# Patient Record
Sex: Female | Born: 1995 | Race: White | Hispanic: No | Marital: Single | Smoking: Never smoker
Health system: Southern US, Community
[De-identification: ages and names within clinical notes are randomized; demographics above are authoritative.]

## PROBLEM LIST (undated history)

## (undated) ENCOUNTER — Inpatient Hospital Stay: Payer: Self-pay

## (undated) DIAGNOSIS — F419 Anxiety disorder, unspecified: Secondary | ICD-10-CM

## (undated) DIAGNOSIS — F329 Major depressive disorder, single episode, unspecified: Secondary | ICD-10-CM

## (undated) DIAGNOSIS — S0300XA Dislocation of jaw, unspecified side, initial encounter: Secondary | ICD-10-CM

## (undated) DIAGNOSIS — F32A Depression, unspecified: Secondary | ICD-10-CM

## (undated) DIAGNOSIS — G44229 Chronic tension-type headache, not intractable: Secondary | ICD-10-CM

## (undated) HISTORY — PX: INNER EAR SURGERY: SHX679

---
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
## 2014-02-12 ENCOUNTER — Emergency Department: Payer: Self-pay | Admitting: Emergency Medicine

## 2014-03-04 IMAGING — US ABDOMEN ULTRASOUND
1 series · 17 of 25 positions shown · non-contrast
Comparison: none

REASON FOR EXAM: ruq pain
COMMENTS:   May transport without cardiac monitor

[Series 1: abdomen ultrasound · 17 of 54 slices shown]
[im 1/54]
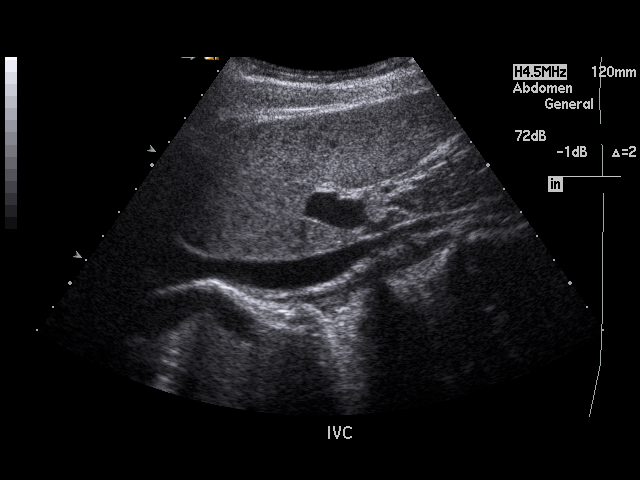
[im 5/54]
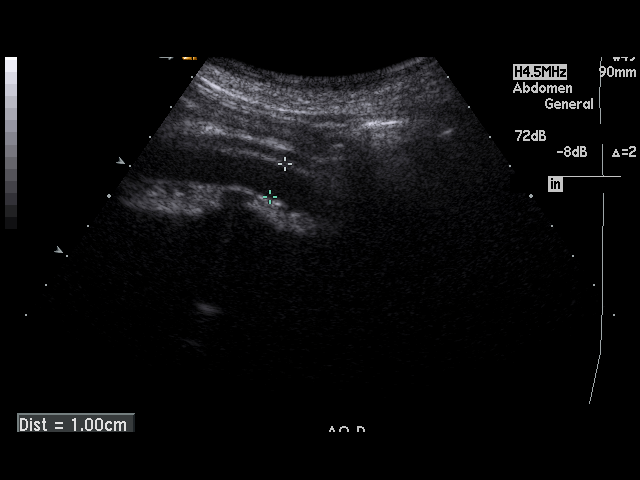
[im 7/54]
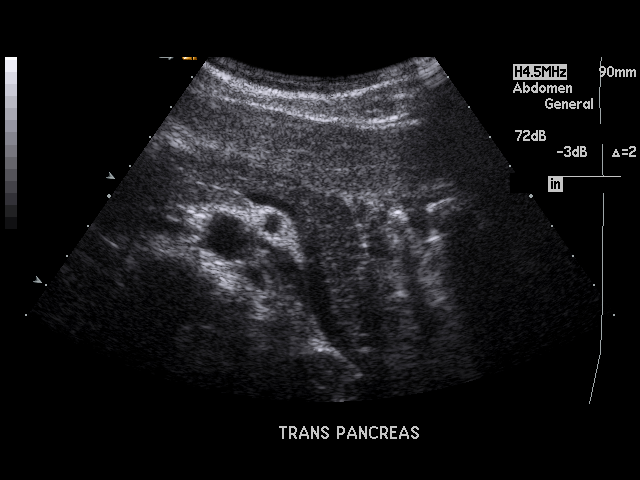
[im 12/54]
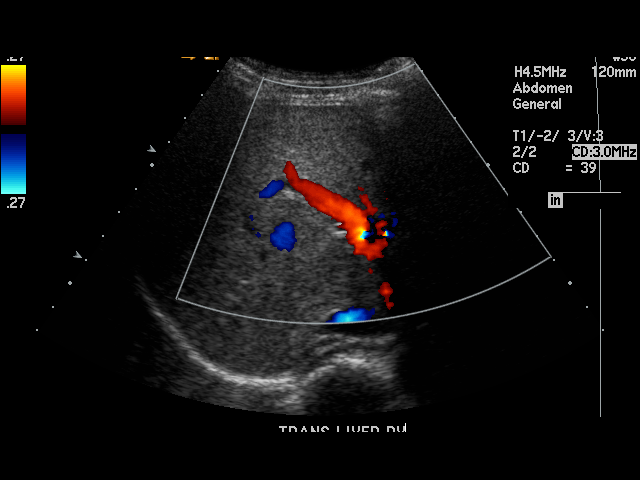
[im 14/54]
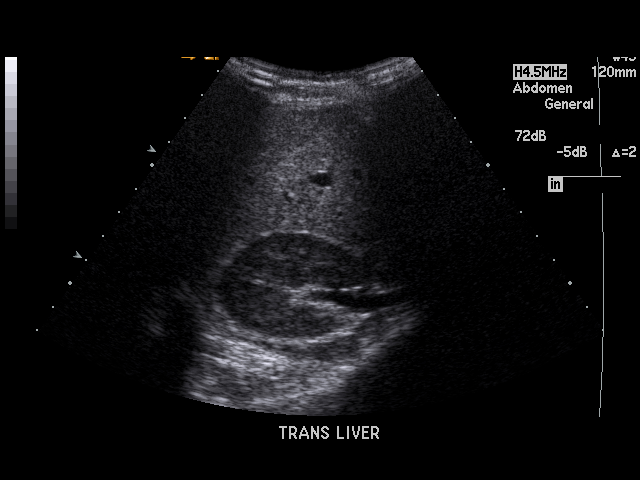
[im 18/54]
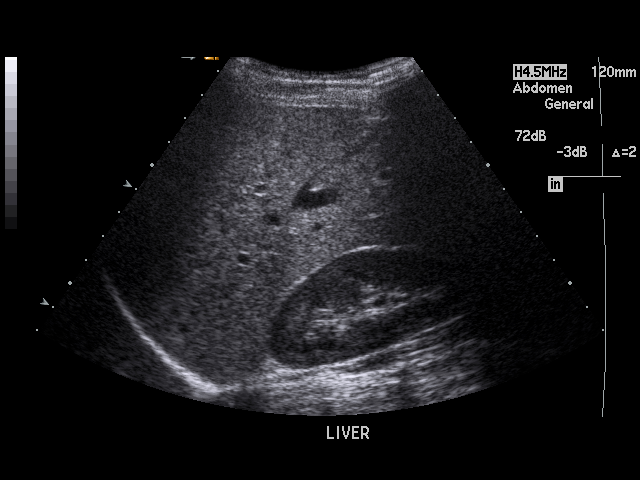
[im 20/54]
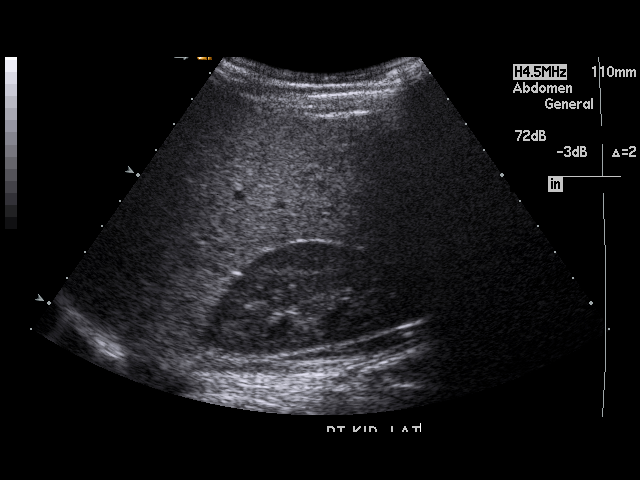
[im 25/54]
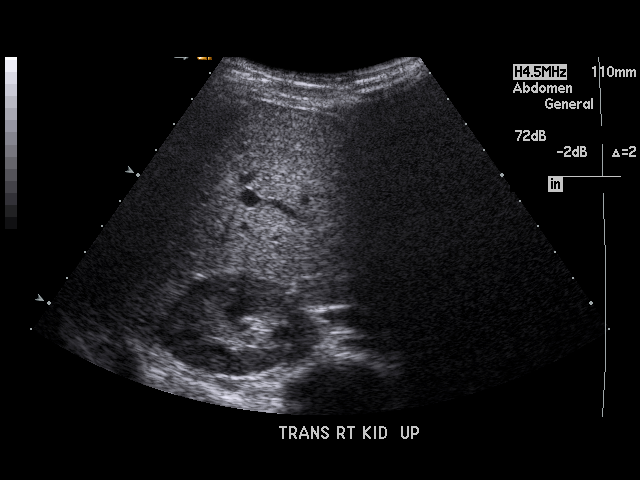
[im 27/54]
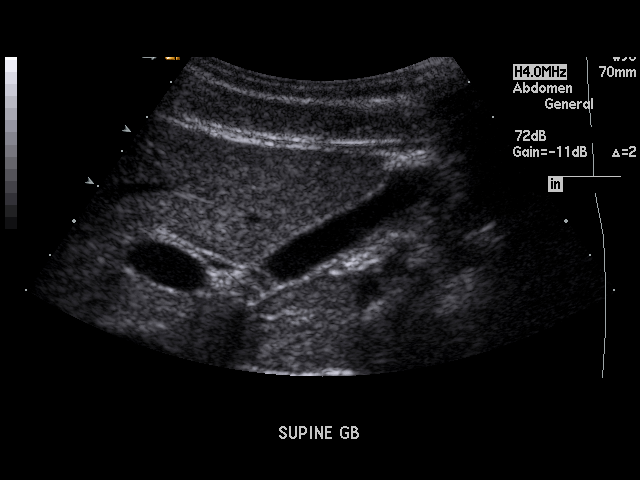
[im 29/54]
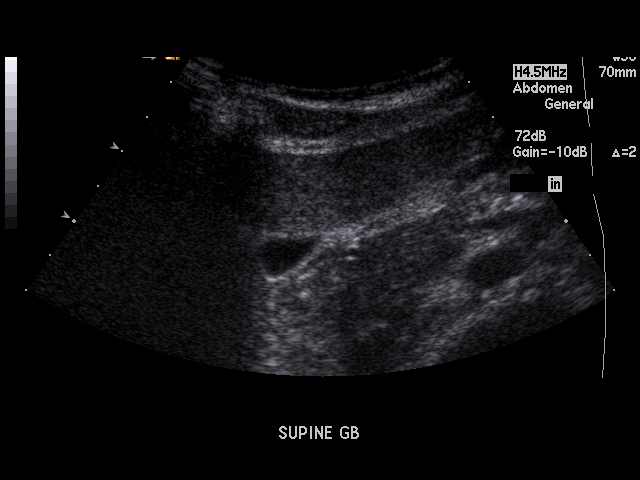
[im 34/54]
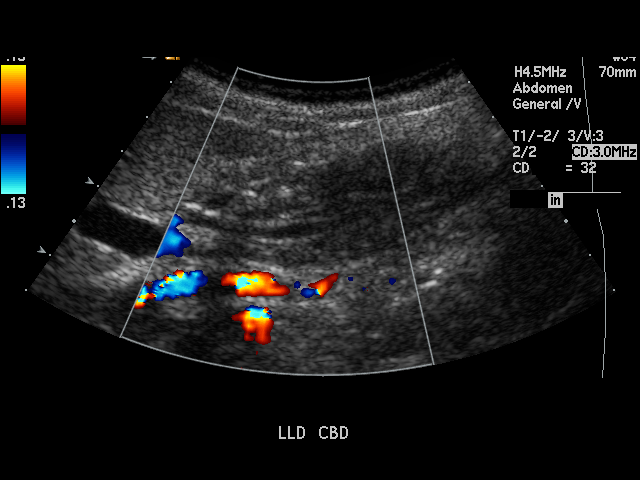
[im 36/54]
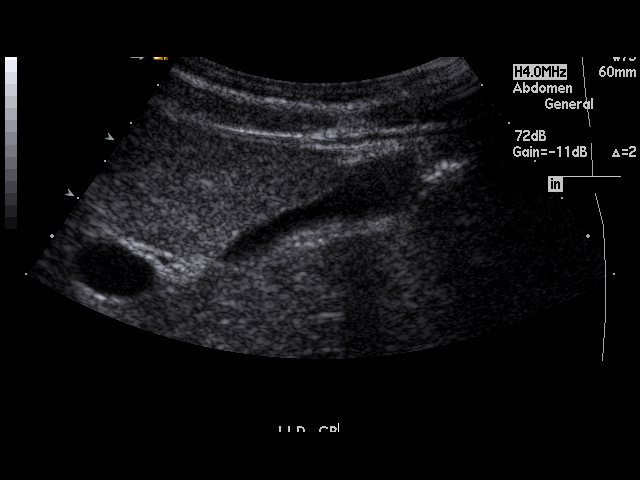
[im 40/54]
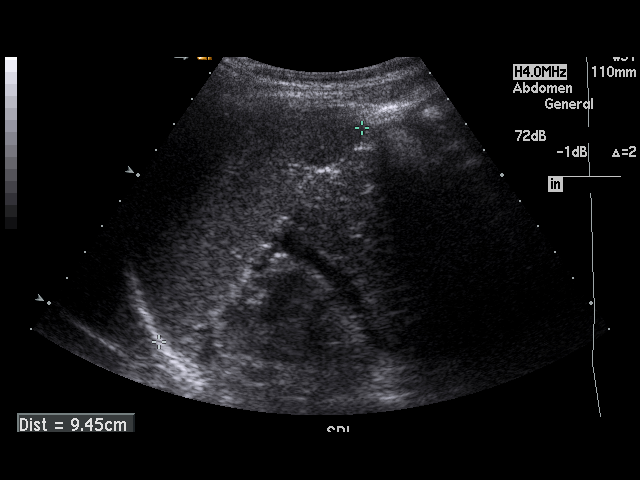
[im 42/54]
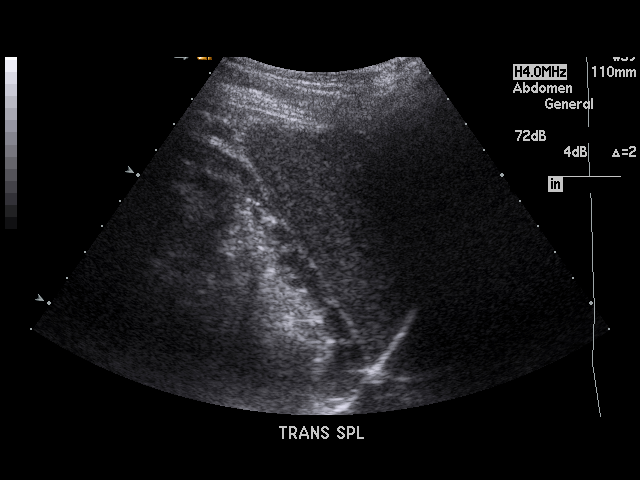
[im 47/54]
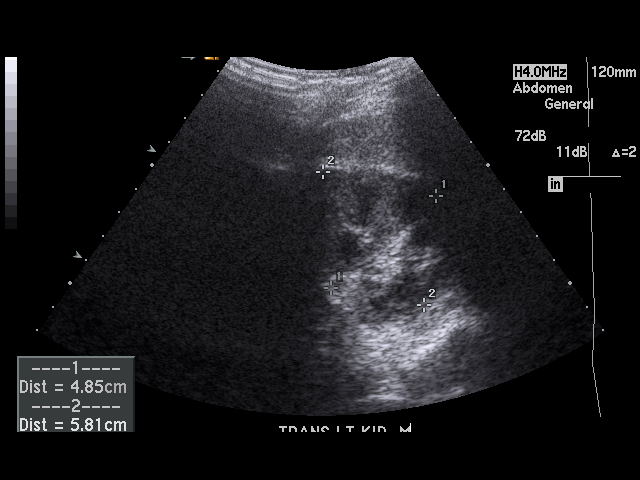
[im 49/54]
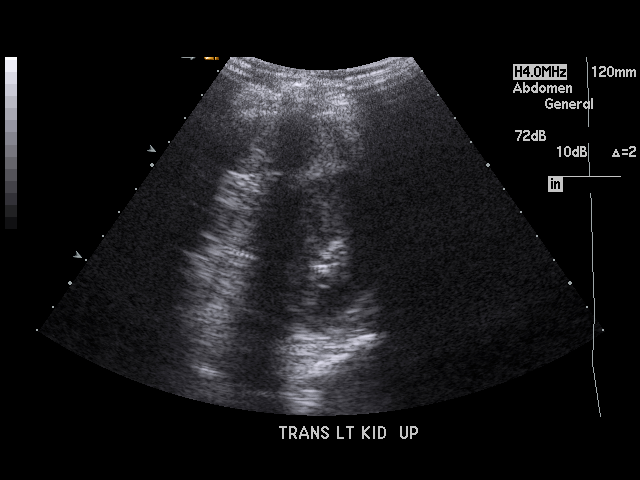
[im 54/54]
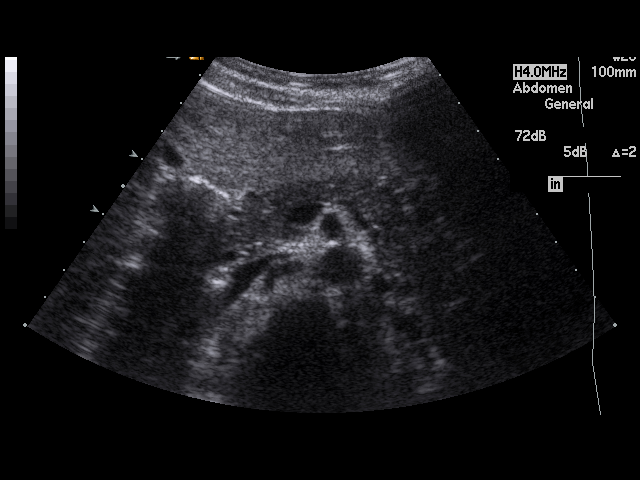

[17 of 25 positions shown; findings below may reference images not displayed]

PROCEDURE:     US  - US ABDOMEN GENERAL SURVEY  - September 06, 2011  [DATE]

RESULT:     The liver exhibits normal echotexture with no focal mass nor
ductal dilation. Portal venous flow is normal in direction toward the liver.
The gallbladder is adequately distended with no evidence of stones, wall
thickening, or pericholecystic fluid. The common bile duct is normal at
mm in diameter.

The pancreas, spleen, and kidneys are normal in appearance. The abdominal
aorta and inferior vena cava are normal in appearance. There is no evidence
of ascites.
IMPRESSION: Normal abdominal ultrasound examination.

A preliminary report was sent to the [HOSPITAL] the lesion of
the study.

## 2014-04-20 ENCOUNTER — Emergency Department: Payer: Self-pay | Admitting: Emergency Medicine

## 2014-05-30 ENCOUNTER — Emergency Department: Payer: Self-pay | Admitting: Emergency Medicine

## 2014-10-09 ENCOUNTER — Emergency Department
Admit: 2014-10-09 | Disposition: A | Discharge status: Home / Self Care | Payer: Self-pay | Admitting: Emergency Medicine

## 2015-03-04 ENCOUNTER — Emergency Department
Admission: EM | Admit: 2015-03-04 | Discharge: 2015-03-04 | Disposition: A | Discharge status: Home / Self Care | Payer: Medicaid Other | Attending: Emergency Medicine | Admitting: Emergency Medicine

## 2015-03-04 ENCOUNTER — Encounter: Payer: Self-pay | Admitting: Emergency Medicine

## 2015-03-04 DIAGNOSIS — R1031 Right lower quadrant pain: Secondary | ICD-10-CM | POA: Insufficient documentation

## 2015-03-04 DIAGNOSIS — Z3202 Encounter for pregnancy test, result negative: Secondary | ICD-10-CM | POA: Diagnosis not present

## 2015-03-04 DIAGNOSIS — R109 Unspecified abdominal pain: Secondary | ICD-10-CM

## 2015-03-04 HISTORY — DX: Dislocation of jaw, unspecified side, initial encounter: S03.00XA

## 2015-03-04 HISTORY — DX: Chronic tension-type headache, not intractable: G44.229

## 2015-03-04 LAB — URINALYSIS COMPLETE WITH MICROSCOPIC (ARMC ONLY)
Bilirubin Urine: NEGATIVE
Glucose, UA: NEGATIVE mg/dL
Hgb urine dipstick: NEGATIVE
Ketones, ur: NEGATIVE mg/dL
Leukocytes, UA: NEGATIVE
Nitrite: NEGATIVE
PH: 6 (ref 5.0–8.0)
Protein, ur: NEGATIVE mg/dL
Specific Gravity, Urine: 1.016 (ref 1.005–1.030)

## 2015-03-04 LAB — CBC WITH DIFFERENTIAL/PLATELET
BASOS ABS: 0.1 10*3/uL (ref 0–0.1)
Basophils Relative: 1 %
EOS PCT: 1 %
Eosinophils Absolute: 0.1 10*3/uL (ref 0–0.7)
HCT: 36.5 % (ref 35.0–47.0)
Hemoglobin: 11.8 g/dL — ABNORMAL LOW (ref 12.0–16.0)
LYMPHS PCT: 26 %
Lymphs Abs: 1.9 10*3/uL (ref 1.0–3.6)
MCH: 27.1 pg (ref 26.0–34.0)
MCHC: 32.5 g/dL (ref 32.0–36.0)
MCV: 83.6 fL (ref 80.0–100.0)
MONO ABS: 0.5 10*3/uL (ref 0.2–0.9)
MONOS PCT: 7 %
Neutro Abs: 4.7 10*3/uL (ref 1.4–6.5)
Neutrophils Relative %: 65 %
PLATELETS: 185 10*3/uL (ref 150–440)
RBC: 4.36 MIL/uL (ref 3.80–5.20)
RDW: 14.4 % (ref 11.5–14.5)
WBC: 7.3 10*3/uL (ref 3.6–11.0)

## 2015-03-04 LAB — POCT PREGNANCY, URINE: Preg Test, Ur: NEGATIVE

## 2015-03-04 MED ORDER — KETOROLAC TROMETHAMINE 30 MG/ML IJ SOLN
30.0000 mg | Freq: Once | INTRAMUSCULAR | Status: AC
  Administered 2015-03-04: 30 mg via INTRAVENOUS
  Filled 2015-03-04: qty 1

## 2015-03-04 MED ORDER — ONDANSETRON HCL 4 MG PO TABS: 4.0000 mg | ORAL_TABLET | Freq: Three times a day (TID) | ORAL | Status: DC | PRN

## 2015-03-04 NOTE — ED Provider Notes (Signed)
Time Seen: Approximately ----------------------------------------- 2:35 PM on 03/04/2015 -----------------------------------------    I have reviewed the triage notes  Chief Complaint: Abdominal Pain   History of Present Illness: Kaitlin Barton is a 19 y.o. female who presents with a one-week history of right lower quadrant abdominal pain. She states it seemed to be worse over the last 24 hours. She states she was concerned she might be pregnant since her last menstrual periods seem to be shortened and was only 3 days. Patient had 2 negative home pregnancy test. She denies any fever at home. She describes some nausea decreased appetite with no vomiting. She denies any dysuria, hematuria, urinary frequency. She denies any vaginal discharge or bleeding. She states she also feels short of breath but it may be secondary to the discomfort. She did not take any pain medication at home. Pain seems to be relatively constant and sometimes worse with movement.   Past Medical History  Diagnosis Date  . Chronic tension headaches   . TMJ (dislocation of temporomandibular joint)     There are no active problems to display for this patient.   History reviewed. No pertinent past surgical history.  History reviewed. No pertinent past surgical history.  No current outpatient prescriptions on file.  Allergies:  Sulfa antibiotics  Family History: No family history on file.  Social History: Social History  Substance Use Topics  . Smoking status: Never Smoker   . Smokeless tobacco: None  . Alcohol Use: No     Review of Systems:   10 point review of systems was performed and was otherwise negative:  Constitutional: No fever Eyes: No visual disturbances ENT: No sore throat, ear pain Cardiac: No chest pain Respiratory: No shortness of breath, wheezing, or stridor Abdomen: Right lower quadrant abdominal pain without radiation to the left lower quadrant she does describe some mild right  flank pain, no vomiting, No diarrhea Endocrine: No weight loss, No night sweats Extremities: No peripheral edema, cyanosis Skin: No rashes, easy bruising Neurologic: No focal weakness, trouble with speech or swollowing Urologic: No dysuria, Hematuria, or urinary frequency   Physical Exam:  ED Triage Vitals  Enc Vitals Group     BP 03/04/15 1348 108/73 mmHg     Pulse Rate 03/04/15 1348 113     Resp 03/04/15 1348 16     Temp 03/04/15 1348 98 F (36.7 C)     Temp Source 03/04/15 1348 Oral     SpO2 03/04/15 1348 100 %     Weight 03/04/15 1348 90 lb (40.824 kg)     Height 03/04/15 1348  (1.6 m)     Head Cir --      Peak Flow --      Pain Score 03/04/15 1352 8     Pain Loc --      Pain Edu? --      Excl. in GC? --     General: Awake , Alert , and Oriented times 3; GCS 15 Head: Normal cephalic , atraumatic Eyes: Pupils equal , round, reactive to light Nose/Throat: No nasal drainage, patent upper airway without erythema or exudate.  Neck: Supple, Full range of motion, No anterior adenopathy or palpable thyroid masses Lungs: Clear to ascultation without wheezes , rhonchi, or rales Heart: Regular rate, regular rhythm without murmurs , gallops , or rubs Abdomen: Soft, non tender without rebound, guarding , or rigidity; bowel sounds positive and symmetric in all 4 quadrants. No organomegaly .  No focal tenderness over  McBurney's point, negative Murphy's sign.      Extremities: 2 plus symmetric pulses. No edema, clubbing or cyanosis Neurologic: normal ambulation, Motor symmetric without deficits, sensory intact Skin: warm, dry, no rashes   Labs:   All laboratory work was reviewed including any pertinent negatives or positives listed below:  Labs Reviewed  URINALYSIS COMPLETEWITH MICROSCOPIC ([facility] ONLY) - Abnormal; Notable for the following:    Color, Urine YELLOW (*)    APPearance CLEAR (*)    Bacteria, UA RARE (*)    Squamous Epithelial / LPF 6-30 (*)    All other  components within normal limits  CBC WITH DIFFERENTIAL/PLATELET  POC URINE PREG, ED   Laboratory work was reviewed with no significant abnormalities  Procedures:  Patient had a repeat exam of her abdomen which does not show any peritoneal signs.    ED Course: Differential diagnosis includes but is not exclusive to ovarian cyst, ovarian torsion, acute appendicitis, urinary tract infection, endometriosis, bowel obstruction, colitis, renal colic, gastroenteritis, etc.  Patient does not appear to have a surgical abdomen such as acute appendicitis. She was advised to return here if she has increasing pain, uncontrolled vomiting at home or any other new concerns such as fever or vaginal discharge or bleeding, etc. Her pain is most likely from a ovarian cyst is based on her history and physical exam. She's been advised take over-the-counter ibuprofen for pain and was prescribed Zofran for nausea   Assessment: Acute unspecified right lower quadrant abdominal pain in a female      Plan:  Patient was advised to return immediately if condition worsens. Patient was advised to follow up with her primary care physician or other specialized physicians involved and in their current assessment.             Jennye Moccasin, MD 03/04/15 [phone]

## 2015-03-04 NOTE — Discharge Instructions (Signed)
Abdominal Pain, Women °Abdominal (stomach, pelvic, or belly) pain can be caused by many things. It is important to tell your doctor: °· The location of the pain. °· Does it come and go or is it present all the time? °· Are there things that start the pain (eating certain foods, exercise)? °· Are there other symptoms associated with the pain (fever, nausea, vomiting, diarrhea)? °All of this is helpful to know when trying to find the cause of the pain. °CAUSES  °· Stomach: virus or bacteria infection, or ulcer. °· Intestine: appendicitis (inflamed appendix), regional ileitis (Crohn's disease), ulcerative colitis (inflamed colon), irritable bowel syndrome, diverticulitis (inflamed diverticulum of the colon), or cancer of the stomach or intestine. °· Gallbladder disease or stones in the gallbladder. °· Kidney disease, kidney stones, or infection. °· Pancreas infection or cancer. °· Fibromyalgia (pain disorder). °· Diseases of the female organs: °¨ Uterus: fibroid (non-cancerous) tumors or infection. °¨ Fallopian tubes: infection or tubal pregnancy. °¨ Ovary: cysts or tumors. °¨ Pelvic adhesions (scar tissue). °¨ Endometriosis (uterus lining tissue growing in the pelvis and on the pelvic organs). °¨ Pelvic congestion syndrome (female organs filling up with blood just before the menstrual period). °¨ Pain with the menstrual period. °¨ Pain with ovulation (producing an egg). °¨ Pain with an IUD (intrauterine device, birth control) in the uterus. °¨ Cancer of the female organs. °· Functional pain (pain not caused by a disease, may improve without treatment). °· Psychological pain. °· Depression. °DIAGNOSIS  °Your doctor will decide the seriousness of your pain by doing an examination. °· Blood tests. °· X-rays. °· Ultrasound. °· CT scan (computed tomography, special type of X-ray). °· MRI (magnetic resonance imaging). °· Cultures, for infection. °· Barium enema (dye inserted in the large intestine, to better view it with  X-rays). °· Colonoscopy (looking in intestine with a lighted tube). °· Laparoscopy (minor surgery, looking in abdomen with a lighted tube). °· Major abdominal exploratory surgery (looking in abdomen with a large incision). °TREATMENT  °The treatment will depend on the cause of the pain.  °· Many cases can be observed and treated at home. °· Over-the-counter medicines recommended by your caregiver. °· Prescription medicine. °· Antibiotics, for infection. °· Birth control pills, for painful periods or for ovulation pain. °· Hormone treatment, for endometriosis. °· Nerve blocking injections. °· Physical therapy. °· Antidepressants. °· Counseling with a psychologist or psychiatrist. °· Minor or major surgery. °HOME CARE INSTRUCTIONS  °· Do not take laxatives, unless directed by your caregiver. °· Take over-the-counter pain medicine only if ordered by your caregiver. Do not take aspirin because it can cause an upset stomach or bleeding. °· Try a clear liquid diet (broth or water) as ordered by your caregiver. Slowly move to a bland diet, as tolerated, if the pain is related to the stomach or intestine. °· Have a thermometer and take your temperature several times a day, and record it. °· Bed rest and sleep, if it helps the pain. °· Avoid sexual intercourse, if it causes pain. °· Avoid stressful situations. °· Keep your follow-up appointments and tests, as your caregiver orders. °· If the pain does not go away with medicine or surgery, you may try: °¨ Acupuncture. °¨ Relaxation exercises (yoga, meditation). °¨ Group therapy. °¨ Counseling. °SEEK MEDICAL CARE IF:  °· You notice certain foods cause stomach pain. °· Your home care treatment is not helping your pain. °· You need stronger pain medicine. °· You want your IUD removed. °· You feel faint or   lightheaded.  You develop nausea and vomiting.  You develop a rash.  You are having side effects or an allergy to your medicine. SEEK IMMEDIATE MEDICAL CARE IF:   Your  pain does not go away or gets worse.  You have a fever.  Your pain is felt only in portions of the abdomen. The right side could possibly be appendicitis. The left lower portion of the abdomen could be colitis or diverticulitis.  You are passing blood in your stools (bright red or black tarry stools, with or without vomiting).  You have blood in your urine.  You develop chills, with or without a fever.  You pass out. MAKE SURE YOU:   Understand these instructions.  Will watch your condition.  Will get help right away if you are not doing well or get worse. Document Released: 03/30/2007 Document Revised: 10/17/2013 Document Reviewed: 04/19/2009 [facility] Patient Information 2015 [place], [place]. This information is not intended to replace advice given to you by your health care provider. Make sure you discuss any questions you have with your health care provider.   Please return immediately if condition worsens. Please contact her primary physician or the physician you were given for referral. If you have any specialist physicians involved in her treatment and plan please also contact them. Thank you for using [facility]. Return especially for increasing pain in the right lower quadrant, fever, persistent vomiting, or any other new concerns

## 2015-03-04 NOTE — ED Notes (Addendum)
Right RLQ abd pain that started a few days ago and has gotten worse. Pt also reports SOB decreased appetite. Denies fever. Denies NVD. Concerns for pregnancy

## 2015-04-27 LAB — OB RESULTS CONSOLE HIV ANTIBODY (ROUTINE TESTING): HIV: NONREACTIVE

## 2015-04-27 LAB — OB RESULTS CONSOLE RUBELLA ANTIBODY, IGM: RUBELLA: NON-IMMUNE/NOT IMMUNE

## 2015-04-27 LAB — OB RESULTS CONSOLE VARICELLA ZOSTER ANTIBODY, IGG: Varicella: IMMUNE

## 2015-04-27 LAB — OB RESULTS CONSOLE RPR: RPR: NONREACTIVE

## 2015-04-27 LAB — OB RESULTS CONSOLE HEPATITIS B SURFACE ANTIGEN: Hepatitis B Surface Ag: NEGATIVE

## 2015-05-20 ENCOUNTER — Emergency Department
Admission: EM | Admit: 2015-05-20 | Discharge: 2015-05-20 | Disposition: A | Discharge status: Home / Self Care | Payer: Medicaid Other | Attending: Emergency Medicine | Admitting: Emergency Medicine

## 2015-05-20 ENCOUNTER — Encounter: Payer: Self-pay | Admitting: Emergency Medicine

## 2015-05-20 DIAGNOSIS — J029 Acute pharyngitis, unspecified: Secondary | ICD-10-CM | POA: Diagnosis not present

## 2015-05-20 DIAGNOSIS — O99511 Diseases of the respiratory system complicating pregnancy, first trimester: Secondary | ICD-10-CM | POA: Diagnosis present

## 2015-05-20 DIAGNOSIS — Z3A13 13 weeks gestation of pregnancy: Secondary | ICD-10-CM | POA: Diagnosis not present

## 2015-05-20 LAB — POCT RAPID STREP A: Streptococcus, Group A Screen (Direct): NEGATIVE

## 2015-05-20 NOTE — ED Provider Notes (Signed)
[facility] Emergency Department Provider Note  ____________________________________________  Time seen: Approximately 4:43 PM  I have reviewed the triage vital signs and the nursing notes.   HISTORY  Chief Complaint Sore Throat    HPI Kaitlin Barton is a 19 y.o. female patient complaining of sore throat and body aches for 3 days. Patient take over-the-counter Delsym for cough. Patient states he weeks' gestation. Patient states she has nausea which is controlled with Zofran. Patient stated this intermittent nonproductive cough. Patient states fever and chills.No other palliative measures taken except for the over-the-counter cough medication. Patient is rating her pain as a 10 over 10.   Past Medical History  Diagnosis Date  . Chronic tension headaches   . TMJ (dislocation of temporomandibular joint)     There are no active problems to display for this patient.   No past surgical history on file.  Current Outpatient Rx  Name  Route  Sig  Dispense  Refill  . ondansetron (ZOFRAN) 4 MG tablet   Oral   Take 1 tablet (4 mg total) by mouth every 8 (eight) hours as needed for nausea or vomiting.   21 tablet   0     Allergies Sulfa antibiotics  No family history on file.  Social History Social History  Substance Use Topics  . Smoking status: Never Smoker   . Smokeless tobacco: Not on file  . Alcohol Use: No    Review of Systems Constitutional: Fever Eyes: No visual changes. ENT: Sore throat Cardiovascular: Denies chest pain. Respiratory: Denies shortness of breath. Gastrointestinal: No abdominal pain.  No nausea, no vomiting.  No diarrhea.  No constipation. Nonproductive cough Genitourinary: Negative for dysuria. Musculoskeletal: Negative for back pain. Skin: Negative for rash. Neurological: Negative for headaches, focal weakness or numbness. Hematological/Lymphatic: Allergic/Immunilogical: Sulfa antibiotics  10-point ROS otherwise  negative.  ____________________________________________   PHYSICAL EXAM:  VITAL SIGNS: ED Triage Vitals  Enc Vitals Group     BP 05/20/15 1543 104/69 mmHg     Pulse Rate 05/20/15 1543 100     Resp 05/20/15 1543 18     Temp 05/20/15 1543 99.1 F (37.3 C)     Temp Source 05/20/15 1543 Oral     SpO2 05/20/15 1543 100 %     Weight 05/20/15 1543 89 lb (40.37 kg)     Height 05/20/15 1543 5\' 3"  (1.6 m)     Head Cir --      Peak Flow --      Pain Score 05/20/15 1548 10     Pain Loc --      Pain Edu? --      Excl. in GC? --     Constitutional: Alert and oriented. Well appearing and in no acute distress. Eyes: Conjunctivae are normal. PERRL. EOMI. Head: Atraumatic. Nose: No congestion/rhinnorhea. Mouth/Throat: Mucous membranes are moist.  Oropharynx erythematous. Neck: No stridor.  No cervical spine tenderness to palpation. Hematological/Lymphatic/Immunilogical: No cervical lymphadenopathy. Cardiovascular: Normal rate, regular rhythm. Grossly normal heart sounds.  Good peripheral circulation. Respiratory: Normal respiratory effort.  No retractions. Lungs CTAB. Gastrointestinal: Soft and nontender. No distention. No abdominal bruits. No CVA tenderness. Musculoskeletal: No lower extremity tenderness nor edema.  No joint effusions. Neurologic:  Normal speech and language. No gross focal neurologic deficits are appreciated. No gait instability. Skin:  Skin is warm, dry and intact. No rash noted. Psychiatric: Mood and affect are normal. Speech and behavior are normal.  ____________________________________________   LABS (all labs ordered are  listed, but only abnormal results are displayed)  Labs Reviewed  POCT RAPID STREP A   ____________________________________________  EKG   ____________________________________________  RADIOLOGY   ____________________________________________   PROCEDURES  Procedure(s) performed: None  Critical Care performed:  No  ____________________________________________   INITIAL IMPRESSION / ASSESSMENT AND PLAN / ED COURSE  Pertinent labs & imaging results that were available during my care of the patient were reviewed by me and considered in my medical decision making (see chart for details).  Viral pharyngitis. Discussed negative rapid strep test with patient. Advised patient cultures pending. Due to the patient gestation and stayed advised supportive care consisting of throat lozenges, Tylenol or ibuprofen. Advised patient to follow-up with family doctor. ____________________________________________   FINAL CLINICAL IMPRESSION(S) / ED DIAGNOSES  Final diagnoses:  Viral pharyngitis      Joni Reining, PA-C 05/20/15 1658  Jeanmarie Plant, MD 05/20/15 2027

## 2015-05-20 NOTE — Discharge Instructions (Signed)

## 2015-05-20 NOTE — ED Notes (Signed)
Sore throat and body aches - [redacted] weeks pregnant - took delsym 9am

## 2015-08-29 ENCOUNTER — Observation Stay
Admission: EM | Admit: 2015-08-29 | Discharge: 2015-08-30 | Disposition: A | Discharge status: Home Health | Payer: Medicaid Other | Attending: Obstetrics and Gynecology | Admitting: Obstetrics and Gynecology

## 2015-08-29 ENCOUNTER — Encounter: Payer: Self-pay | Admitting: *Deleted

## 2015-08-29 DIAGNOSIS — O9989 Other specified diseases and conditions complicating pregnancy, childbirth and the puerperium: Secondary | ICD-10-CM | POA: Insufficient documentation

## 2015-08-29 DIAGNOSIS — O2613 Low weight gain in pregnancy, third trimester: Principal | ICD-10-CM | POA: Insufficient documentation

## 2015-08-29 DIAGNOSIS — O4700 False labor before 37 completed weeks of gestation, unspecified trimester: Secondary | ICD-10-CM | POA: Diagnosis present

## 2015-08-29 DIAGNOSIS — Z882 Allergy status to sulfonamides status: Secondary | ICD-10-CM | POA: Insufficient documentation

## 2015-08-29 DIAGNOSIS — O479 False labor, unspecified: Secondary | ICD-10-CM | POA: Diagnosis present

## 2015-08-29 DIAGNOSIS — Z681 Body mass index (BMI) 19 or less, adult: Secondary | ICD-10-CM | POA: Insufficient documentation

## 2015-08-29 DIAGNOSIS — Z3A28 28 weeks gestation of pregnancy: Secondary | ICD-10-CM | POA: Diagnosis not present

## 2015-08-29 DIAGNOSIS — N898 Other specified noninflammatory disorders of vagina: Secondary | ICD-10-CM | POA: Diagnosis not present

## 2015-08-29 MED ORDER — SODIUM CHLORIDE FLUSH 0.9 % IV SOLN
INTRAVENOUS | Status: AC
  Filled 2015-08-29: qty 20

## 2015-08-29 MED ORDER — SODIUM CHLORIDE 0.9 % IJ SOLN
INTRAMUSCULAR | Status: AC
  Filled 2015-08-29: qty 50

## 2015-08-29 NOTE — OB Triage Note (Signed)
20 year old G1 c/o back pain and vaginal discharge at 6661w0d. Allso c/o burning,urgency, and frequent urination, No bleeding, no LOF +FM.

## 2015-08-30 ENCOUNTER — Telehealth: Payer: Self-pay | Admitting: Obstetrics and Gynecology

## 2015-08-30 DIAGNOSIS — O2613 Low weight gain in pregnancy, third trimester: Secondary | ICD-10-CM | POA: Diagnosis not present

## 2015-08-30 DIAGNOSIS — O479 False labor, unspecified: Secondary | ICD-10-CM | POA: Diagnosis present

## 2015-08-30 DIAGNOSIS — O4700 False labor before 37 completed weeks of gestation, unspecified trimester: Secondary | ICD-10-CM | POA: Diagnosis present

## 2015-08-30 LAB — URINALYSIS COMPLETE WITH MICROSCOPIC (ARMC ONLY)
Bilirubin Urine: NEGATIVE
GLUCOSE, UA: NEGATIVE mg/dL
HGB URINE DIPSTICK: NEGATIVE
Ketones, ur: NEGATIVE mg/dL
NITRITE: NEGATIVE
PH: 8 (ref 5.0–8.0)
Protein, ur: NEGATIVE mg/dL
SPECIFIC GRAVITY, URINE: 1.004 — AB (ref 1.005–1.030)

## 2015-08-30 LAB — CBC
HCT: 27.9 % — ABNORMAL LOW (ref 35.0–47.0)
Hemoglobin: 9.3 g/dL — ABNORMAL LOW (ref 12.0–16.0)
MCH: 26.5 pg (ref 26.0–34.0)
MCHC: 33.4 g/dL (ref 32.0–36.0)
MCV: 79.2 fL — AB (ref 80.0–100.0)
PLATELETS: 197 10*3/uL (ref 150–440)
RBC: 3.52 MIL/uL — AB (ref 3.80–5.20)
RDW: 15.3 % — ABNORMAL HIGH (ref 11.5–14.5)
WBC: 9.9 10*3/uL (ref 3.6–11.0)

## 2015-08-30 LAB — WET PREP, GENITAL
SPERM: NONE SEEN
Yeast Wet Prep HPF POC: NONE SEEN

## 2015-08-30 LAB — ABO/RH: ABO/RH(D): O POS

## 2015-08-30 LAB — TYPE AND SCREEN
ABO/RH(D): O POS
Antibody Screen: NEGATIVE

## 2015-08-30 LAB — CHLAMYDIA/NGC RT PCR (ARMC ONLY)
CHLAMYDIA TR: NOT DETECTED
N GONORRHOEAE: NOT DETECTED

## 2015-08-30 MED ORDER — ACETAMINOPHEN 325 MG PO TABS
650.0000 mg | ORAL_TABLET | ORAL | Status: DC | PRN
  Administered 2015-08-30: 650 mg via ORAL
  Filled 2015-08-30: qty 2

## 2015-08-30 MED ORDER — LACTATED RINGERS IV SOLN
INTRAVENOUS | Status: DC
  Administered 2015-08-30: 01:00:00 via INTRAVENOUS

## 2015-08-30 MED ORDER — CALCIUM CARBONATE ANTACID 500 MG PO CHEW: 2.0000 | CHEWABLE_TABLET | ORAL | Status: DC | PRN

## 2015-08-30 MED ORDER — FERROUS SULFATE 325 (65 FE) MG PO TABS: 325.0000 mg | ORAL_TABLET | Freq: Two times a day (BID) | ORAL | Status: DC

## 2015-08-30 MED ORDER — DOCUSATE SODIUM 100 MG PO CAPS: 100.0000 mg | ORAL_CAPSULE | Freq: Every day | ORAL | Status: DC

## 2015-08-30 MED ORDER — ZOLPIDEM TARTRATE 5 MG PO TABS: 5.0000 mg | ORAL_TABLET | Freq: Every evening | ORAL | Status: DC | PRN

## 2015-08-30 MED ORDER — METRONIDAZOLE 500 MG PO TABS: 500.0000 mg | ORAL_TABLET | Freq: Two times a day (BID) | ORAL | Status: AC

## 2015-08-30 MED ORDER — METRONIDAZOLE 500 MG PO TABS
2000.0000 mg | ORAL_TABLET | Freq: Once | ORAL | Status: AC
  Administered 2015-08-30: 2000 mg via ORAL
  Filled 2015-08-30: qty 4
  Filled 2015-08-30: qty 1

## 2015-08-30 MED ORDER — LACTATED RINGERS IV BOLUS (SEPSIS)
1000.0000 mL | Freq: Once | INTRAVENOUS | Status: AC
  Administered 2015-08-30: 1000 mL via INTRAVENOUS

## 2015-08-30 MED ORDER — PRENATAL MULTIVITAMIN CH
1.0000 | ORAL_TABLET | Freq: Every day | ORAL | Status: DC
  Filled 2015-08-30: qty 1

## 2015-08-30 MED ORDER — METRONIDAZOLE 500 MG PO TABS
ORAL_TABLET | ORAL | Status: AC
  Administered 2015-08-30: 2000 mg via ORAL
  Filled 2015-08-30: qty 3

## 2015-08-30 NOTE — H&P (Signed)
MD NOTE  LMP: 02/12/15 EDD: 11/21/2015  HPI: Kaitlin Barton G1P0 @ 28wks here with back pain intermittent, dysuria, since early this evening. Came on suddenly. Describes back pain as 7/10. Also with vaginal discharge. Used vaginal gel this evening for discharge. No VB, no lof, +FM. No fevers/chills/n/v/throat pain/cough/runny nose. No other complaints.   APC: [facility] 1. Poor weight gain in pregnancy - visits with [facility] Pre-gravid BMI 15.8. Patient reports that she "has always been small." Denies history of eating disorder. Reports significant N/V in first trimester, but now this is much improved and she is eating well and gaining weight. Seeing Nutrition at local [facility] regularly. Eating high-calorie diet including Boost TID, whole milk.  - Given pre-gravid BMI <18.5 kg/m2 (underweight): recommended weight gain 28 to 40 lbs (12.5 to 18.0 kg) - TWG: 2.404 kg - below target but gained weight from last visit, will continue to monitor closely - Encouraged regular follow up with Nutrition at [facility] (pt declined referral to [facility]) - Fetal biometry is consistently lagging behind gestational dating. Decision made NOT to re-date by MFM. - Last EFW 488g (26%) on 07/24/15.  Next growth [place] scheduled for 09/18/15.   Labs:   GTT 1 HOUR PERFORMABLE - Final result (08/06/2015 12:40 PM)  Component Value Range  Glucose, GTT - 1 Hour 105 Undefined mg/dL   Rubella non-immune  Past Medical History  Diagnosis Date  . Chronic tension headaches   . TMJ (dislocation of temporomandibular joint)     History reviewed. No pertinent past surgical history.  Allergies  Allergen Reactions  . Sulfa Antibiotics Hives    Social History   Social History  . Marital Status: Single    Spouse Name: N/A  . Number of Children: N/A  . Years of Education: N/A   Occupational History  . Not on file.   Social History Main Topics  . Smoking status: Never Smoker   . Smokeless tobacco: Not on file  . Alcohol Use: No  .  Drug Use: No  . Sexual Activity: Not on file   Other Topics Concern  . Not on file   Social History Narrative    No current facility-administered medications on file prior to encounter.   Current Outpatient Prescriptions on File Prior to Encounter  Medication Sig Dispense Refill  . ondansetron (ZOFRAN) 4 MG tablet Take 1 tablet (4 mg total) by mouth every 8 (eight) hours as needed for nausea or vomiting. 21 tablet 0    O: Filed Vitals:   08/29/15 2245 08/29/15 2256  BP: 125/76   Pulse: 103   Temp: 98.4 F (36.9 C) 98.4 F (36.9 C)  Resp: 20     General appearance: alert, cooperative and no distress Head: Normocephalic, without obvious abnormality, atraumatic Eyes: conjunctivae/corneas clear. PERRL, EOM's intact. Fundi benign. Abdomen: soft, non-tender; bowel sounds normal; no masses,  no organomegaly Pelvic: yellow vaginal discharge, no blood Extremities: extremities normal, atraumatic, no cyanosis or edema and very thin Skin: Skin color, texture, turgor normal. No rashes or lesions   SVE: 12:10am closed/thick  EFM: 150 mod var, 10x10 accels, no decels Toco: Q3 min  A/P:  20yo G1P0 @ 28wks with preterm contractions likely due to dehydration. No cervical change on initial exam. Will IVH aggressively and w/u for UTI.   1. Preterm contractions - NPO - IVH 1L bolus in 1 hr, then 125cc/hr - UA/Ucx - wet prep - recheck in 2 hours - if cervical change or continued contractions, will consider BMZ  and nifedipine  Kaitlin DachJohanna K Izzabella Besse, MD

## 2015-08-30 NOTE — Telephone Encounter (Signed)
Repeat SVE still closed after 2 hours  Contractions resolved, pt improved Wet prep with Trich and BV Treated with 2g Flagyl in triage, rx for 6 more days of BID flagyl for BV completion Condoms  Encouraged hydration.  Precautions given.   D/C Home

## 2015-08-30 NOTE — Discharge Instructions (Signed)
Take medications as prescribed 1. Trichomonas - 2g flagyl today 2. BV -  twice daily for 6 days  CONDOMS FOR 2 WEEKS  Metronidazole tablets or capsules What is this medicine? METRONIDAZOLE (me troe NI da zole) is an antiinfective. It is used to treat certain kinds of bacterial and protozoal infections. It will not work for colds, flu, or other viral infections. This medicine may be used for other purposes; ask your health care provider or pharmacist if you have questions. What should I tell my health care provider before I take this medicine? They need to know if you have any of these conditions: -anemia or other blood disorders -disease of the nervous system -fungal or yeast infection -if you drink alcohol containing drinks -liver disease -seizures -an unusual or allergic reaction to metronidazole, or other medicines, foods, dyes, or preservatives -pregnant or trying to get pregnant -breast-feeding How should I use this medicine? Take this medicine by mouth with a full glass of water. Follow the directions on the prescription label. Take your medicine at regular intervals. Do not take your medicine more often than directed. Take all of your medicine as directed even if you think you are better. Do not skip doses or stop your medicine early. Talk to your pediatrician regarding the use of this medicine in children. Special care may be needed. Overdosage: If you think you have taken too much of this medicine contact a poison control center or emergency room at once. NOTE: This medicine is only for you. Do not share this medicine with others. What if I miss a dose? If you miss a dose, take it as soon as you can. If it is almost time for your next dose, take only that dose. Do not take double or extra doses. What may interact with this medicine? Do not take this medicine with any of the following medications: -alcohol or any product that contains alcohol -amprenavir oral  solution -cisapride -disulfiram -dofetilide -dronedarone -paclitaxel injection -pimozide -ritonavir oral solution -sertraline oral solution -sulfamethoxazole-trimethoprim injection -thioridazine -ziprasidone This medicine may also interact with the following medications: -birth control pills -cimetidine -lithium -other medicines that prolong the QT interval (cause an abnormal heart rhythm) -phenobarbital -phenytoin -warfarin This list may not describe all possible interactions. Give your health care provider a list of all the medicines, herbs, non-prescription drugs, or dietary supplements you use. Also tell them if you smoke, drink alcohol, or use illegal drugs. Some items may interact with your medicine. What should I watch for while using this medicine? Tell your doctor or health care professional if your symptoms do not improve or if they get worse. You may get drowsy or dizzy. Do not drive, use machinery, or do anything that needs mental alertness until you know how this medicine affects you. Do not stand or sit up quickly, especially if you are an older patient. This reduces the risk of dizzy or fainting spells. Avoid alcoholic drinks while you are taking this medicine and for three days afterward. Alcohol may make you feel dizzy, sick, or flushed. If you are being treated for a sexually transmitted disease, avoid sexual contact until you have finished your treatment. Your sexual partner may also need treatment. What side effects may I notice from receiving this medicine? Side effects that you should report to your doctor or health care professional as soon as possible: -allergic reactions like skin rash or hives, swelling of the face, lips, or tongue -confusion, clumsiness -difficulty speaking -discolored or sore mouth -dizziness -  fever, infection -numbness, tingling, pain or weakness in the hands or feet -trouble passing urine or change in the amount of urine -redness,  blistering, peeling or loosening of the skin, including inside the mouth -seizures -unusually weak or tired -vaginal irritation, dryness, or discharge Side effects that usually do not require medical attention (report to your doctor or health care professional if they continue or are bothersome): -diarrhea -headache -irritability -metallic taste -nausea -stomach pain or cramps -trouble sleeping This list may not describe all possible side effects. Call your doctor for medical advice about side effects. You may report side effects to FDA at 1-800-FDA-1088. Where should I keep my medicine? Keep out of the reach of children. Store at room temperature below 25 degrees C (77 degrees F). Protect from light. Keep container tightly closed. Throw away any unused medicine after the expiration date. NOTE: This sheet is a summary. It may not cover all possible information. If you have questions about this medicine, talk to your doctor, pharmacist, or health care provider.    2016, Elsevier/Gold Standard. (2013-01-07 14:08:39)  Trichomoniasis Trichomoniasis is an infection caused by an organism called Trichomonas. The infection can affect both women and men. In women, the outer female genitalia and the vagina are affected. In men, the penis is mainly affected, but the prostate and other reproductive organs can also be involved. Trichomoniasis is a sexually transmitted infection (STI) and is most often passed to another person through sexual contact.  RISK FACTORS  Having unprotected sexual intercourse.  Having sexual intercourse with an infected partner. SIGNS AND SYMPTOMS  Symptoms of trichomoniasis in women include:  Abnormal gray-green frothy vaginal discharge.  Itching and irritation of the vagina.  Itching and irritation of the area outside the vagina. Symptoms of trichomoniasis in men include:   Penile discharge with or without pain.  Pain during urination. This results from  inflammation of the urethra. DIAGNOSIS  Trichomoniasis may be found during a Pap test or physical exam. Your health care provider may use one of the following methods to help diagnose this infection:  Testing the pH of the vagina with a test tape.  Using a vaginal swab test that checks for the Trichomonas organism. A test is available that provides results within a few minutes.  Examining a urine sample.  Testing vaginal secretions. Your health care provider may test you for other STIs, including HIV. TREATMENT   You may be given medicine to fight the infection. Women should inform their health care provider if they could be or are pregnant. Some medicines used to treat the infection should not be taken during pregnancy.  Your health care provider may recommend over-the-counter medicines or creams to decrease itching or irritation.  Your sexual partner will need to be treated if infected.  Your health care provider may test you for infection again 3 months after treatment. HOME CARE INSTRUCTIONS   Take medicines only as directed by your health care provider.  Take over-the-counter medicine for itching or irritation as directed by your health care provider.  Do not have sexual intercourse while you have the infection.  Women should not douche or wear tampons while they have the infection.  Discuss your infection with your partner. Your partner may have gotten the infection from you, or you may have gotten it from your partner.  Have your sex partner get examined and treated if necessary.  Practice safe, informed, and protected sex.  See your health care provider for other STI testing. SEEK MEDICAL  CARE IF:   You still have symptoms after you finish your medicine.  You develop abdominal pain.  You have pain when you urinate.  You have bleeding after sexual intercourse.  You develop a rash.  Your medicine makes you sick or makes you throw up (vomit). MAKE SURE  YOU:  Understand these instructions.  Will watch your condition.  Will get help right away if you are not doing well or get worse.   This information is not intended to replace advice given to you by your health care provider. Make sure you discuss any questions you have with your health care provider.   Document Released: 11/26/2000 Document Revised: 06/23/2014 Document Reviewed: 03/14/2013 Elsevier Interactive Patient Education 2016 Elsevier Inc.  Bacterial Vaginosis Bacterial vaginosis is a vaginal infection that occurs when the normal balance of bacteria in the vagina is disrupted. It results from an overgrowth of certain bacteria. This is the most common vaginal infection in women of childbearing age. Treatment is important to prevent complications, especially in pregnant women, as it can cause a premature delivery. CAUSES  Bacterial vaginosis is caused by an increase in harmful bacteria that are normally present in smaller amounts in the vagina. Several different kinds of bacteria can cause bacterial vaginosis. However, the reason that the condition develops is not fully understood. RISK FACTORS Certain activities or behaviors can put you at an increased risk of developing bacterial vaginosis, including:  Having a new sex partner or multiple sex partners.  Douching.  Using an intrauterine device (IUD) for contraception. Women do not get bacterial vaginosis from toilet seats, bedding, swimming pools, or contact with objects around them. SIGNS AND SYMPTOMS  Some women with bacterial vaginosis have no signs or symptoms. Common symptoms include:  Grey vaginal discharge.  A fishlike odor with discharge, especially after sexual intercourse.  Itching or burning of the vagina and vulva.  Burning or pain with urination. DIAGNOSIS  Your health care provider will take a medical history and examine the vagina for signs of bacterial vaginosis. A sample of vaginal fluid may be taken.  Your health care provider will look at this sample under a microscope to check for bacteria and abnormal cells. A vaginal pH test may also be done.  TREATMENT  Bacterial vaginosis may be treated with antibiotic medicines. These may be given in the form of a pill or a vaginal cream. A second round of antibiotics may be prescribed if the condition comes back after treatment. Because bacterial vaginosis increases your risk for sexually transmitted diseases, getting treated can help reduce your risk for chlamydia, gonorrhea, HIV, and herpes. HOME CARE INSTRUCTIONS   Only take over-the-counter or prescription medicines as directed by your health care provider.  If antibiotic medicine was prescribed, take it as directed. Make sure you finish it even if you start to feel better.  Tell all sexual partners that you have a vaginal infection. They should see their health care provider and be treated if they have problems, such as a mild rash or itching.  During treatment, it is important that you follow these instructions:  Avoid sexual activity or use condoms correctly.  Do not douche.  Avoid alcohol as directed by your health care provider.  Avoid breastfeeding as directed by your health care provider. SEEK MEDICAL CARE IF:   Your symptoms are not improving after 3 days of treatment.  You have increased discharge or pain.  You have a fever. MAKE SURE YOU:   Understand these instructions.  Will watch your condition.  Will get help right away if you are not doing well or get worse. FOR MORE INFORMATION  Centers for Disease Control and Prevention, Division of STD Prevention: SolutionApps.co.za American Sexual Health Association (ASHA): www.ashastd.org    This information is not intended to replace advice given to you by your health care provider. Make sure you discuss any questions you have with your health care provider.   Document Released: 06/02/2005 Document Revised: 06/23/2014 Document  Reviewed: 01/12/2013 Elsevier Interactive Patient Education [employer]

## 2015-09-01 LAB — URINE CULTURE

## 2015-10-10 ENCOUNTER — Encounter: Payer: Self-pay | Admitting: *Deleted

## 2015-10-10 ENCOUNTER — Observation Stay
Admission: EM | Admit: 2015-10-10 | Discharge: 2015-10-13 | Disposition: A | Discharge status: Home / Self Care | Payer: Medicaid Other | Attending: Obstetrics and Gynecology | Admitting: Obstetrics and Gynecology

## 2015-10-10 DIAGNOSIS — O365990 Maternal care for other known or suspected poor fetal growth, unspecified trimester, not applicable or unspecified: Secondary | ICD-10-CM | POA: Diagnosis present

## 2015-10-10 DIAGNOSIS — R51 Headache: Secondary | ICD-10-CM | POA: Diagnosis not present

## 2015-10-10 DIAGNOSIS — O365910 Maternal care for other known or suspected poor fetal growth, first trimester, not applicable or unspecified: Secondary | ICD-10-CM | POA: Insufficient documentation

## 2015-10-10 DIAGNOSIS — Z3A11 11 weeks gestation of pregnancy: Secondary | ICD-10-CM | POA: Diagnosis not present

## 2015-10-10 DIAGNOSIS — Z79899 Other long term (current) drug therapy: Secondary | ICD-10-CM | POA: Insufficient documentation

## 2015-10-10 DIAGNOSIS — O131 Gestational [pregnancy-induced] hypertension without significant proteinuria, first trimester: Principal | ICD-10-CM | POA: Insufficient documentation

## 2015-10-10 DIAGNOSIS — O26891 Other specified pregnancy related conditions, first trimester: Secondary | ICD-10-CM | POA: Insufficient documentation

## 2015-10-10 DIAGNOSIS — Z882 Allergy status to sulfonamides status: Secondary | ICD-10-CM | POA: Diagnosis not present

## 2015-10-10 DIAGNOSIS — O163 Unspecified maternal hypertension, third trimester: Secondary | ICD-10-CM

## 2015-10-10 LAB — TYPE AND SCREEN
ABO/RH(D): O POS
Antibody Screen: NEGATIVE

## 2015-10-10 LAB — WET PREP, GENITAL
Clue Cells Wet Prep HPF POC: NONE SEEN
SPERM: NONE SEEN
TRICH WET PREP: NONE SEEN
Yeast Wet Prep HPF POC: NONE SEEN

## 2015-10-10 LAB — CBC
HCT: 34.7 % — ABNORMAL LOW (ref 35.0–47.0)
HEMOGLOBIN: 11.4 g/dL — AB (ref 12.0–16.0)
MCH: 26.7 pg (ref 26.0–34.0)
MCHC: 33 g/dL (ref 32.0–36.0)
MCV: 81 fL (ref 80.0–100.0)
Platelets: 201 10*3/uL (ref 150–440)
RBC: 4.28 MIL/uL (ref 3.80–5.20)
RDW: 18.4 % — ABNORMAL HIGH (ref 11.5–14.5)
WBC: 7.5 10*3/uL (ref 3.6–11.0)

## 2015-10-10 LAB — COMPREHENSIVE METABOLIC PANEL
ALK PHOS: 204 U/L — AB (ref 38–126)
ALT: 11 U/L — ABNORMAL LOW (ref 14–54)
ANION GAP: 10 (ref 5–15)
AST: 23 U/L (ref 15–41)
Albumin: 3.2 g/dL — ABNORMAL LOW (ref 3.5–5.0)
BILIRUBIN TOTAL: 0.4 mg/dL (ref 0.3–1.2)
BUN: 5 mg/dL — ABNORMAL LOW (ref 6–20)
CALCIUM: 9.1 mg/dL (ref 8.9–10.3)
CO2: 22 mmol/L (ref 22–32)
CREATININE: 0.57 mg/dL (ref 0.44–1.00)
Chloride: 106 mmol/L (ref 101–111)
Glucose, Bld: 78 mg/dL (ref 65–99)
Potassium: 4.3 mmol/L (ref 3.5–5.1)
SODIUM: 138 mmol/L (ref 135–145)
TOTAL PROTEIN: 7 g/dL (ref 6.5–8.1)

## 2015-10-10 LAB — URINALYSIS COMPLETE WITH MICROSCOPIC (ARMC ONLY)
BACTERIA UA: NONE SEEN
BILIRUBIN URINE: NEGATIVE
Glucose, UA: NEGATIVE mg/dL
HGB URINE DIPSTICK: NEGATIVE
Ketones, ur: NEGATIVE mg/dL
NITRITE: NEGATIVE
PH: 7 (ref 5.0–8.0)
Protein, ur: NEGATIVE mg/dL
SPECIFIC GRAVITY, URINE: 1.002 — AB (ref 1.005–1.030)

## 2015-10-10 LAB — PROTEIN / CREATININE RATIO, URINE: Creatinine, Urine: 18 mg/dL

## 2015-10-10 MED ORDER — ACETAMINOPHEN 325 MG PO TABS
650.0000 mg | ORAL_TABLET | ORAL | Status: DC | PRN
  Administered 2015-10-11: 650 mg via ORAL
  Filled 2015-10-10: qty 2

## 2015-10-10 MED ORDER — DOCUSATE SODIUM 100 MG PO CAPS
100.0000 mg | ORAL_CAPSULE | Freq: Every day | ORAL | Status: DC
  Filled 2015-10-10 (×2): qty 1

## 2015-10-10 MED ORDER — CALCIUM CARBONATE ANTACID 500 MG PO CHEW: 2.0000 | CHEWABLE_TABLET | ORAL | Status: DC | PRN

## 2015-10-10 MED ORDER — LABETALOL HCL 5 MG/ML IV SOLN
20.0000 mg | INTRAVENOUS | Status: DC | PRN
  Filled 2015-10-10: qty 16

## 2015-10-10 MED ORDER — ZOLPIDEM TARTRATE 5 MG PO TABS: 5.0000 mg | ORAL_TABLET | Freq: Every evening | ORAL | Status: DC | PRN

## 2015-10-10 MED ORDER — LACTATED RINGERS IV SOLN
INTRAVENOUS | Status: DC
  Administered 2015-10-10: 500 mL/h via INTRAVENOUS

## 2015-10-10 MED ORDER — PRENATAL MULTIVITAMIN CH
1.0000 | ORAL_TABLET | Freq: Every day | ORAL | Status: DC
  Administered 2015-10-11 – 2015-10-13 (×3): 1 via ORAL
  Filled 2015-10-10 (×5): qty 1

## 2015-10-10 MED ORDER — HYDRALAZINE HCL 20 MG/ML IJ SOLN
10.0000 mg | Freq: Once | INTRAMUSCULAR | Status: AC | PRN
  Administered 2015-10-10: 10 mg via INTRAVENOUS
  Filled 2015-10-10: qty 1

## 2015-10-10 NOTE — OB Triage Note (Signed)
Pt. Came in from home with lower left back pain and lower right abd. Pain started around 12:30, today. Pain. No vaginal bleeding, per patient.

## 2015-10-10 NOTE — OB Triage Provider Note (Addendum)
Antepartum Admission/Observation   History of Present Illness: Kaitlin Barton is a 20 y.o. G1P0 at 2611w0d presenting with vague left sided back/flank pain that is intermittent and RLQ pain which has resolved. She is a patient of [facility] and [facility] for elevated BP with IUGR; I do not see Doppler results. She is getting weekly NSTs and is in the middle of a 24hr urine collection today. She did have a headache earlier today which has spontaneously resolved. She had blurred vision while looking at her phone which resolved spontaneously.  - Fetal biometry consistently lagging behind gestational dating. - 09/18/15: EFW 1118g (6%), AC < 3%, AFI 12.6, normal Dopplers. Per US report: These fetal findings could be representative of fetal growth restriction or a constitutionally small normal fetus. Rec weekly NSTs, repeat growth 3-4 wks.   She also has a hx of kidney stones in the past, none in this pregnancy. She is concerned for a UTI.  Good fetal movement, normal vaginal discharge, no itching. Some burning/tenderness with urination. No LOF, no contractions, though the monitor is showing occasional contractions with irritability. No recent intercourse.   Patient Active Problem List   Diagnosis Date Noted  . Preterm contractions 08/30/2015    Past Medical History  Diagnosis Date  . Chronic tension headaches   . TMJ (dislocation of temporomandibular joint)     History reviewed. No pertinent past surgical history.  OB History  Gravida Para Term Preterm AB SAB TAB Ectopic Multiple Living  1             # Outcome Date GA Lbr Len/2nd Weight Sex Delivery Anes PTL Lv  1 Current               Social History   Social History  . Marital Status: Single    Spouse Name: N/A  . Number of Children: N/A  . Years of Education: N/A   Social History Main Topics  . Smoking status: Never Smoker   . Smokeless tobacco: None  . Alcohol Use: No  . Drug Use:  No  . Sexual Activity: Yes   Other Topics Concern  . None   Social History Narrative    History reviewed. No pertinent family history.  Allergies  Allergen Reactions  . Sulfa Antibiotics Hives    Prescriptions prior to admission  Medication Sig Dispense Refill Last Dose  . butalbital-acetaminophen-caffeine (FIORICET, ESGIC) 50-325-40 MG tablet Take by mouth 2 (two) times daily as needed for headache.   Past Month at Unknown time  . ferrous sulfate 325 (65 FE) MG tablet Take 1 tablet (325 mg total) by mouth 2 (two) times daily with a meal. 90 tablet 3 10/10/2015 at Unknown time  . Prenatal Vit-Fe Fumarate-FA (PRENATAL MULTIVITAMIN) TABS tablet Take 1 tablet by mouth daily at 12 noon.   Past Week at Unknown time  . ondansetron (ZOFRAN) 4 MG tablet Take 1 tablet (4 mg total) by mouth every 8 (eight) hours as needed for nausea or vomiting. (Patient not taking: Reported on 10/10/2015) 21 tablet 0 Not Taking at Unknown time    Review of Systems - Negative except as noted in HPI  Vitals:  BP 141/101 mmHg  Pulse 91  Temp(Src) 98.2 F (36.8 C) (Oral)  Ht 5\' 3"  (1.6 m)  Wt 44.906 kg (99 lb)  BMI 17.54 kg/m2  LMP 02/12/2015 Physical Examination: CONSTITUTIONAL: Well-developed, well-nourished female in no acute distress.  HENT:  Normocephalic, atraumatic EYES:  Conjunctivae and EOM are normal. No scleral icterus.  NECK: Normal range of motion, supple, SKIN: Skin is warm and dry. No rash noted. Not diaphoretic. No erythema. No pallor. NEUROLGIC: Alert and oriented to person, place, and time. No gross cranial nerve deficit noted. Reflexes 2+ PSYCHIATRIC: Normal mood and affect. Normal behavior. Normal judgment and thought content. CARDIOVASCULAR: Normal heart rate noted, regular rhythm RESPIRATORY: Effort and breath sounds normal, no problems with respiration noted ABDOMEN: Soft, nontender, nondistended, gravid.  Cervix:  Membranes:intact Fetal Monitoring:Baseline: 140 bpm,  Variability: Good {> 6 bpm), Accelerations: Reactive and Decelerations: Absent Tocometer: irritable and irregular Cervix: Closed/thick and posterior. Fetal head present on anetrior vaginal wall. Cephalic by Leopolds  Labs:  Results for orders placed or performed during the hospital encounter of 10/10/15 (from the past 24 hour(s))  CBC on admission   Collection Time: 10/10/15  3:38 PM  Result Value Ref Range   WBC 7.5 3.6 - 11.0 K/uL   RBC 4.28 3.80 - 5.20 MIL/uL   Hemoglobin 11.4 (L) 12.0 - 16.0 g/dL   HCT 16.1 (L) 09.6 - 04.5 %   MCV 81.0 80.0 - 100.0 fL   MCH 26.7 26.0 - 34.0 pg   MCHC 33.0 32.0 - 36.0 g/dL   RDW 40.9 (H) 81.1 - 91.4 %   Platelets 201 150 - 440 K/uL  Comprehensive metabolic panel   Collection Time: 10/10/15  3:38 PM  Result Value Ref Range   Sodium 138 135 - 145 mmol/L   Potassium 4.3 3.5 - 5.1 mmol/L   Chloride 106 101 - 111 mmol/L   CO2 22 22 - 32 mmol/L   Glucose, Bld 78 65 - 99 mg/dL   BUN 5 (L) 6 - 20 mg/dL   Creatinine, Ser 7.82 0.44 - 1.00 mg/dL   Calcium 9.1 8.9 - 95.6 mg/dL   Total Protein 7.0 6.5 - 8.1 g/dL   Albumin 3.2 (L) 3.5 - 5.0 g/dL   AST 23 15 - 41 U/L   ALT 11 (L) 14 - 54 U/L   Alkaline Phosphatase 204 (H) 38 - 126 U/L   Total Bilirubin 0.4 0.3 - 1.2 mg/dL   GFR calc non Af Amer >60 >60 mL/min   GFR calc Af Amer >60 >60 mL/min   Anion gap 10 5 - 15  Type and screen [facility]   Collection Time: 10/10/15  3:38 PM  Result Value Ref Range   ABO/RH(D) O POS    Antibody Screen NEG    Sample Expiration 10/13/2015   Urinalysis complete, with microscopic ([facility] only)   Collection Time: 10/10/15  3:59 PM  Result Value Ref Range   Color, Urine STRAW (A) YELLOW   APPearance HAZY (A) CLEAR   Glucose, UA NEGATIVE NEGATIVE mg/dL   Bilirubin Urine NEGATIVE NEGATIVE   Ketones, ur NEGATIVE NEGATIVE mg/dL   Specific Gravity, Urine 1.002 (L) 1.005 - 1.030   Hgb urine dipstick NEGATIVE NEGATIVE   pH 7.0 5.0 - 8.0   Protein,  ur NEGATIVE NEGATIVE mg/dL   Nitrite NEGATIVE NEGATIVE   Leukocytes, UA 3+ (A) NEGATIVE   RBC / HPF 0-5 0 - 5 RBC/hpf   WBC, UA 6-30 0 - 5 WBC/hpf   Bacteria, UA NONE SEEN NONE SEEN   Squamous Epithelial / LPF 6-30 (A) NONE SEEN   Mucous PRESENT   Protein / creatinine ratio, urine   Collection Time: 10/10/15  3:59 PM  Result Value Ref Range   Creatinine, Urine 18 mg/dL  Total Protein, Urine <6 mg/dL   Protein Creatinine Ratio        0.00 - 0.15 mg/mg[Cre]    Imaging Studies: No results found.   Assessment and Plan: Patient Active Problem List   Diagnosis Date Noted  . Preterm contractions 08/30/2015   Admit for overnight obs  1. Elevated BP: In mild range with Max DPB of 101Fetal monitoring and SBP of 141. Vague sx now resolved. Pt seems reliable and not dramatic. PreE labs reassuring- no protein in urine and too low to measure in P:C ratio.  2. Left lower back pain: DDx includes Renal calculus, UTI, MSK. Possible BV or yeast referral. Wet mount pending. WBC normal. Afebrile. UA negative for blood, nitrites, squamous cells positive and leukocytes positive. 3. Infection: GBS pending. GC/Ct pending. Wet mount pending. Her WBC are normal and she is afebrile. 4. Uterine irritability: Fetal fibronectin collected and not sent when cervix closed. Possible reaction to vaginal infection or UTI. Reassuring cervix closed. 5. Fetal status: Reassuring and reactive with Cat I strip  Will continue to monitor sx and review wet mount. If BP elevations to severe range would give steriods and antihypertensives and plan for delivery.  Will get growth ultrasound/AFI with MFM tomorrow, with Dopplers if needed.  Plan to treat infection if returns positive.  Cline Cools, MD, MPH

## 2015-10-11 ENCOUNTER — Ambulatory Visit
Admit: 2015-10-11 | Discharge: 2015-10-11 | Disposition: A | Discharge status: Home / Self Care | Payer: Medicaid Other | Attending: Maternal & Fetal Medicine | Admitting: Maternal & Fetal Medicine

## 2015-10-11 DIAGNOSIS — IMO0002 Reserved for concepts with insufficient information to code with codable children: Secondary | ICD-10-CM

## 2015-10-11 DIAGNOSIS — O131 Gestational [pregnancy-induced] hypertension without significant proteinuria, first trimester: Secondary | ICD-10-CM | POA: Diagnosis not present

## 2015-10-11 LAB — CHLAMYDIA/NGC RT PCR (ARMC ONLY)
Chlamydia Tr: NOT DETECTED
N GONORRHOEAE: NOT DETECTED

## 2015-10-11 MED ORDER — FERROUS SULFATE 325 (65 FE) MG PO TABS
325.0000 mg | ORAL_TABLET | Freq: Two times a day (BID) | ORAL | Status: DC
  Administered 2015-10-11 – 2015-10-13 (×4): 325 mg via ORAL
  Filled 2015-10-11 (×4): qty 1

## 2015-10-11 MED ORDER — PRENATAL MULTIVITAMIN CH: 1.0000 | ORAL_TABLET | Freq: Every day | ORAL | Status: DC

## 2015-10-11 MED ORDER — SODIUM CHLORIDE FLUSH 0.9 % IV SOLN
INTRAVENOUS | Status: AC
  Filled 2015-10-11: qty 10

## 2015-10-11 MED ORDER — ONDANSETRON HCL 4 MG/2ML IJ SOLN
4.0000 mg | Freq: Once | INTRAMUSCULAR | Status: AC
  Administered 2015-10-11: 4 mg via INTRAVENOUS
  Filled 2015-10-11: qty 2

## 2015-10-11 MED ORDER — BETAMETHASONE SOD PHOS & ACET 6 (3-3) MG/ML IJ SUSP
INTRAMUSCULAR | Status: AC
  Administered 2015-10-11: 12 mg via INTRAMUSCULAR
  Filled 2015-10-11: qty 1

## 2015-10-11 MED ORDER — BETAMETHASONE SOD PHOS & ACET 6 (3-3) MG/ML IJ SUSP
12.0000 mg | Freq: Once | INTRAMUSCULAR | Status: AC
  Administered 2015-10-11: 12 mg via INTRAMUSCULAR

## 2015-10-11 MED ORDER — BUTALBITAL-APAP-CAFFEINE 50-325-40 MG PO TABS
1.0000 | ORAL_TABLET | Freq: Two times a day (BID) | ORAL | Status: DC | PRN
  Administered 2015-10-13: 1 via ORAL
  Filled 2015-10-11 (×2): qty 1

## 2015-10-11 MED ORDER — ONDANSETRON HCL 4 MG PO TABS: 4.0000 mg | ORAL_TABLET | Freq: Three times a day (TID) | ORAL | Status: DC | PRN

## 2015-10-11 NOTE — Progress Notes (Signed)
Pt to u/s

## 2015-10-11 NOTE — Progress Notes (Signed)
Patient ID: Kaitlin Barton, female   DOB: Jan 19, 1996, 20 y.o.   MRN: [id number]  19yo G1P0 at 34+1wks with hx of IUGR, gHTN, poor maternal weight gain. Follows with [facility] high risk but does fetal testing at [facility] for convenience.  Her max BP here was 165/109 x1, with moderate range BP overnight. She is experiencing headaches that she says she always has and are not different, some blurry vision yesterday but no black spots that she feels were related to looking at her phone.  She is well dated by certain LMP and 5140w0d u/s that was consistent with dates. Her PreE labs are normal with no measurable protein in urine.   MFM ultrasound and Dopplers today:   EFW 1381g (3#1oz) <3% AFI: 10.2cm Normal umbilical artery Dopplers Remarkable placental calcifications  Symmetric FGR <3%, a significant difference of last EFW on 09/18/15 in the 6%. Doppler studies normal. Fluid normal.   MFM recommendation at least twice weekly testing and weekly AFI with Doppler studies recommended.  Because of her elevated BP that have resolved with bedrest, I am giving her antenatal steroids now, restarting her 24hr urine protein, and will recheck her BP after she ambulates. I plan on monitoring and serial BP for 24 hours and reconsider tomorrow.    Pregnancy also complicated by:  **Reports history of anxiety, previously on medication (patient uncertain which one) but stopped due to decreased appetite. Previously received counseling but not recently. Patient reports significant stress and anxiety at the start of this pregnancy due to conflict with the father of her child, situation has improved and she feels that her mood is stable. Reports good support from her family and friends. - 07/24/15 EPDS = 9, denied SI/HI [ ]  offered perinatal psych referral, pt declines  **At 5159w5d, Hgb/Hct 10.3/32.3, meeting criteria for 2nd trimester anemia. MCV 84.7. - ferritin 10.1, B12, folate, reticulocytes all wnl - recommended PO iron BID w/  stool softener prn [x]  check compliance - pt reports compliance [x]  repeat CBC 4/4 showed improvement with Hgb/Hct 12.0/37.1, ferritin 19.2 - encouraged continued PO iron BID  **- IUGR in setting of maternal underweight (see separate problem).  - Fetal biometry consistently lagging behind gestational dating. Decision made NOT to re-date by MFM. - 09/18/15: EFW 1118g (6%), AC < 3%, AFI 12.6, normal Dopplers. Per US report: These fetal findings could be representative of fetal growth restriction or a constitutionally small normal fetus. Rec weekly NSTs, repeat growth 3-4 wks. Delivery timing pending further studies.  [ ]  Weekly NSTs starting 09/27/15. Pt will try to schedule closer to [facility] and [facility].  [ ]  Next growth US scheduled for 10/16/15   **Pre-gravid BMI 15.8. Patient reports that she "has always been small." Denies history of eating disorder. Reports significant N/V in first trimester, but now this is much improved and she is eating well and gaining weight. [facility]. Eating high-calorie diet including Boost TID, whole milk.  - Given pre-gravid BMI <18.5 kg/m2 (underweight): recommended weight gain 28 to 40 lbs (12.5 to 18.0 kg) - TWG on 07/13/15: 3.175 kg - below target but gained weight from last visit, reviewed healthy diet, will continue to monitor closely - [facility] (pt [facility] as she already goes to her local clinic every 2 weeks) - Fetal biometry is consistently lagging behind gestational dating. Decision made NOT to re-date by MFM because of excellent early dating.

## 2015-10-11 NOTE — Progress Notes (Signed)
Assuming care of pt

## 2015-10-12 DIAGNOSIS — O365990 Maternal care for other known or suspected poor fetal growth, unspecified trimester, not applicable or unspecified: Secondary | ICD-10-CM | POA: Diagnosis present

## 2015-10-12 DIAGNOSIS — O131 Gestational [pregnancy-induced] hypertension without significant proteinuria, first trimester: Secondary | ICD-10-CM | POA: Diagnosis not present

## 2015-10-12 LAB — URINE DRUG SCREEN, QUALITATIVE (ARMC ONLY)
AMPHETAMINES, UR SCREEN: NOT DETECTED
Barbiturates, Ur Screen: NOT DETECTED
Benzodiazepine, Ur Scrn: NOT DETECTED
CANNABINOID 50 NG, UR ~~LOC~~: NOT DETECTED
Cocaine Metabolite,Ur ~~LOC~~: NOT DETECTED
MDMA (ECSTASY) UR SCREEN: NOT DETECTED
METHADONE SCREEN, URINE: NOT DETECTED
Opiate, Ur Screen: NOT DETECTED
Phencyclidine (PCP) Ur S: NOT DETECTED
TRICYCLIC, UR SCREEN: NOT DETECTED

## 2015-10-12 LAB — PROTEIN, URINE, 24 HOUR
Collection Interval-UPROT: 24 hours
PROTEIN, 24H URINE: 109 mg/d — AB (ref 50–100)
Protein, Urine: 14 mg/dL
Urine Total Volume-UPROT: 775 mL

## 2015-10-12 MED ORDER — SODIUM CHLORIDE 0.9% FLUSH
10.0000 mL | Freq: Three times a day (TID) | INTRAVENOUS | Status: DC
  Administered 2015-10-12 – 2015-10-13 (×4): 10 mL via INTRAVENOUS

## 2015-10-12 MED ORDER — BETAMETHASONE SOD PHOS & ACET 6 (3-3) MG/ML IJ SUSP
12.0000 mg | Freq: Once | INTRAMUSCULAR | Status: AC
  Administered 2015-10-12: 12 mg via INTRAMUSCULAR
  Filled 2015-10-12: qty 2

## 2015-10-12 NOTE — Progress Notes (Addendum)
ANTEPARTUM NOTE: 20 yo G1P0  At 1434 2/7 weeks [facility] significant for hx of IUGR, GHTN & poor maternal weight gain. Pt admitted yest due to elevated BP's 165/109 with mod range BP's during the night. +blurry vision yest but, no black spots. LMP certain and 12 scan indicates consistent EDD.  US: 10/11/15 EFW 1381 g (3#1oz) < 3% AFI 10.2 cm with normal artery Dopplers Remarkable calcifications of placenta  Pt here for NST monitoring and decision of testing including Dopplers, NST, 24 h urine for protein and evaluation of BP's. Discussed case with Dr Feliberto GottronSchermerhorn and we will follow [facility] recommendations for NST 2 x a week, Weekly AFI and dopplers.  Will do drug screen.

## 2015-10-12 NOTE — Progress Notes (Signed)
Because of increased census in birthplace, we were unable to perform NST until 0230. Dr Dalbert GarnetBeasley informed and MD stated the pt had just been monitored in L&D before tr to M/B. Plan for NST in am  Per Dr Dalbert GarnetBeasley

## 2015-10-12 NOTE — H&P (Signed)
HISTORY AND PHYSICAL  HISTORY OF PRESENT ILLNESS: Kaitlin Barton is a 20 y.o. G1P0 at [redacted]w[redacted]d  with LMP of 02/14/15 & EDD of 11/21/15 and 11  week ultrasound  with a pregnancy complicated by Gest HTN and poor weight gain, IUGR <3% with normal Dopplers and  presenting for BP evaluation and continued AFI, Dopplers and NST per [facility]. BP range is elevated this p to 137/93.   She has not  been having contractions  and denies leakage of fluid, vaginal bleeding, or decreased fetal movement.    REVIEW OF SYSTEMS: A complete review of systems was performed and was specifically negative for headache, changes in vision, RUQ pain, shortness of breath, chest pain, lower extremity edema and dysuria.   HISTORY:  Past Medical History  Diagnosis Date  . Chronic tension headaches   . TMJ (dislocation of temporomandibular joint)   LOW BMI 15 at start of pregnancy   History reviewed. No pertinent past surgical history.  No current facility-administered medications on file prior to encounter.   Current Outpatient Prescriptions on File Prior to Encounter  Medication Sig Dispense Refill  . butalbital-acetaminophen-caffeine (FIORICET, ESGIC) 50-325-40 MG tablet Take by mouth 2 (two) times daily as needed for headache.    . ferrous sulfate 325 (65 FE) MG tablet Take 1 tablet (325 mg total) by mouth 2 (two) times daily with a meal. 90 tablet 3  . Prenatal Vit-Fe Fumarate-FA (PRENATAL MULTIVITAMIN) TABS tablet Take 1 tablet by mouth daily at 12 noon.    . ondansetron (ZOFRAN) 4 MG tablet Take 1 tablet (4 mg total) by mouth every 8 (eight) hours as needed for nausea or vomiting. (Patient not taking: Reported on 10/10/2015) 21 tablet 0     Allergies  Allergen Reactions  . Sulfa Antibiotics Hives    OB History  Gravida Para Term Preterm AB SAB TAB Ectopic Multiple Living  1             # Outcome Date GA Lbr Len/2nd Weight Sex Delivery Anes PTL Lv  1 Current               Gynecologic History: +Trich with  TOC History of Abnormal Pap Smear: History of STI: Trich with TOC  Social History  Substance Use Topics  . Smoking status: Never Smoker   . Smokeless tobacco: None  . Alcohol Use: No    PHYSICAL EXAM: Temp:  [97.9 F (36.6 C)-98.5 F (36.9 C)] 98 F (36.7 C) (04/28 2145) Pulse Rate:  [57-83] 69 (04/28 2230) Resp:  [18-20] 18 (04/28 2145) BP: (122-153)/(70-96) 147/93 mmHg (04/28 2230) SpO2:  [100 %] 100 % (04/28 2145)  GENERAL: NAD AAOx3 CHEST:CTAB no increased work of breathing CV:RRR no appreciable murmurs, rubs, gallops ABDOMEN: gravid, nontender, EFW 3#10oz by Leopolds EXTREMITIES:  Warm and well-perfused, nontender, nonedematous,  DTRs 1+ clonus 0 CERVIX: not evaluated SPECULUM: not performed  FHT:s baseline with  Mod variability + accelerations and no  Decelerations, Cat 1  Toco: none  DIAGNOSTIC STUDIES:  Recent Labs Lab 10/10/15 1538  WBC 7.5  HGB 11.4*  HCT 34.7*  PLT 201  NA 138  K 4.3  CL 106  CO2 22  BUN 5*  CREATININE 0.57  GLUCOSE 78  CALCIUM 9.1  ALKPHOS 204*  AST 23  ALT 11*  PROT 7.0    PRENATAL STUDIES:  Prenatal Labs:  MBT: O pos Rubella immune, Varicella immune, HIV neg, RPR neg, Hep B neg, GC/CT neg, GBS not found, glucola  3 h GCT WNL.  Last US 17 5/7 wks 229  g (8 oz) placenta above the os, AF wnl, normal anatomy  ASSESSMENT AND PLAN:  1. Fetal Well being  - Fetal Tracing: reactive with 2 accels 15 x 15 BPM - Ultrasound:  reviewed, as above - Group B Streptococcus:Unknown - Presentation: Vtx confirmed by clinic  2. Routine OB: - Prenatal labs reviewed, as above - Rh pos  3. Gest HTN:  -  Contractions no external toco in place -  Pelvis proven to none -  Plan for NST, AFI & Dopplers weekly  4. Post Partum Planning: - Infant feeding: not known - Contraception:not decided

## 2015-10-13 DIAGNOSIS — O131 Gestational [pregnancy-induced] hypertension without significant proteinuria, first trimester: Secondary | ICD-10-CM | POA: Diagnosis not present

## 2015-10-13 LAB — CULTURE, BETA STREP (GROUP B ONLY)

## 2015-10-13 MED ORDER — LABETALOL HCL 100 MG PO TABS
100.0000 mg | ORAL_TABLET | Freq: Two times a day (BID) | ORAL | Status: DC
  Administered 2015-10-13 (×2): 100 mg via ORAL
  Filled 2015-10-13 (×2): qty 1

## 2015-10-13 NOTE — Progress Notes (Signed)
Pt returned to rm 348, proceding with discharge per MD order.

## 2015-10-13 NOTE — Progress Notes (Signed)
Pt transferred to Obs 3 for NST

## 2015-10-13 NOTE — Progress Notes (Signed)
Pt discharged home.  Discharge instructions, prescriptions and follow up appointment given to and reviewed with pt.  Pt verbalized understanding.  Escorted by staff. 

## 2015-10-13 NOTE — Discharge Summary (Signed)
Obstetric Discharge Summary Reason for Admission: admitted for LBP and h/a and elevated BP . Pt known to have IUGR fetus and is being followed by MFM with recent Doppler  Prenatal Procedures: NST testing , IM betamethasone x 2 doses  IHospital course: pt was admitted 10/10/15 and underwent a 24hr urine protein ( normal 109mg ) , CMP and cbc = nl . Labile BP  Which improved with bed rest . Started on Labetalol on 10/12/15 100 mg bid . She received 2 doses of Betamethasone for FLM . Reactive NST on day of d/c .  H/a improved with fioracet . [facility] . Working diagnosis: gestational htn , IUGR < 3% with normal recent feal Doppler and reactive NST .  HEMOGLOBIN  Date Value Ref Range Status  10/10/2015 11.4* 12.0 - 16.0 g/dL Final   HGB  Date Value Ref Range Status  02/10/2013 12.9 12.0-16.0 g/dL Final   HCT  Date Value Ref Range Status  10/10/2015 34.7* 35.0 - 47.0 % Final  02/10/2013 38.9 35.0-47.0 % Final    Physical Exam:  General: alert and cooperative Uterine Fundus: soft, gravid  DVT Evaluation: No evidence of DVT seen on physical exam., no evidence  Discharge Diagnoses: IUGR , gestational HTN , headache  Discharge Information: Date: 10/13/2015 Activity: pelvic rest Diet: routine Medications: fioracet , labetalol 100 mg bid  Condition: good Instructions: refer to practice specific booklet Discharge to: home Follow-up Information    [facility].   Why:  NST on L+D and MFM u/s    Contact information:   [address] [place] [id number]        SCHERMERHORN,THOMAS 10/13/2015, 11:09 AM

## 2015-10-13 NOTE — Progress Notes (Signed)
Will start Labetalol 100 mg po BID as BP is mildly elevated this pm. BP range 140's-150's93-98. No HA, blurred vision, no RUQ pain or decreased fM. Prot/creat 2 days ago was SNL.

## 2015-10-16 ENCOUNTER — Observation Stay
Admission: EM | Admit: 2015-10-16 | Discharge: 2015-10-16 | Payer: Medicaid Other | Attending: Obstetrics and Gynecology | Admitting: Obstetrics and Gynecology

## 2015-10-16 ENCOUNTER — Encounter: Payer: Self-pay | Admitting: *Deleted

## 2015-10-16 DIAGNOSIS — O365930 Maternal care for other known or suspected poor fetal growth, third trimester, not applicable or unspecified: Secondary | ICD-10-CM | POA: Insufficient documentation

## 2015-10-16 DIAGNOSIS — Z3A34 34 weeks gestation of pregnancy: Secondary | ICD-10-CM | POA: Insufficient documentation

## 2015-10-16 DIAGNOSIS — O133 Gestational [pregnancy-induced] hypertension without significant proteinuria, third trimester: Principal | ICD-10-CM | POA: Insufficient documentation

## 2015-10-16 LAB — PROTEIN / CREATININE RATIO, URINE
CREATININE, URINE: 169 mg/dL
PROTEIN CREATININE RATIO: 0.21 mg/mg{creat} — AB (ref 0.00–0.15)
TOTAL PROTEIN, URINE: 35 mg/dL

## 2015-10-16 LAB — BASIC METABOLIC PANEL
Anion gap: 8 (ref 5–15)
BUN: 7 mg/dL (ref 6–20)
CHLORIDE: 107 mmol/L (ref 101–111)
CO2: 24 mmol/L (ref 22–32)
CREATININE: 0.52 mg/dL (ref 0.44–1.00)
Calcium: 8.9 mg/dL (ref 8.9–10.3)
GFR calc Af Amer: >60 mL/min (ref >60)
GFR calc non Af Amer: >60 mL/min (ref >60)
GLUCOSE: 78 mg/dL (ref 65–99)
POTASSIUM: 4.3 mmol/L (ref 3.5–5.1)
SODIUM: 139 mmol/L (ref 135–145)

## 2015-10-16 LAB — CBC
HEMATOCRIT: 34.8 % — AB (ref 35.0–47.0)
Hemoglobin: 11.4 g/dL — ABNORMAL LOW (ref 12.0–16.0)
MCH: 27 pg (ref 26.0–34.0)
MCHC: 32.6 g/dL (ref 32.0–36.0)
MCV: 82.8 fL (ref 80.0–100.0)
PLATELETS: 174 10*3/uL (ref 150–440)
RBC: 4.21 MIL/uL (ref 3.80–5.20)
RDW: 18.5 % — AB (ref 11.5–14.5)
WBC: 8.4 10*3/uL (ref 3.6–11.0)

## 2015-10-16 LAB — HEPATIC FUNCTION PANEL
ALK PHOS: 169 U/L — AB (ref 38–126)
ALT: 23 U/L (ref 14–54)
AST: 31 U/L (ref 15–41)
Albumin: 3.2 g/dL — ABNORMAL LOW (ref 3.5–5.0)
BILIRUBIN TOTAL: 0.3 mg/dL (ref 0.3–1.2)
Total Protein: 6.8 g/dL (ref 6.5–8.1)

## 2015-10-16 MED ORDER — HYDRALAZINE HCL 20 MG/ML IJ SOLN
5.0000 mg | INTRAMUSCULAR | Status: DC | PRN
Start: 2015-10-16 — End: 2015-10-16

## 2015-10-16 MED ORDER — LABETALOL HCL 5 MG/ML IV SOLN: 20.0000 mg | INTRAVENOUS | Status: DC | PRN

## 2015-10-16 NOTE — Discharge Instructions (Signed)
°  [facility] for evaluation, US and doppler studies. They are expecting you within the next hour.

## 2015-10-16 NOTE — OB Triage Note (Signed)
Here for NST secondary to fetal growth restriction

## 2015-10-16 NOTE — Discharge Summary (Signed)
  Patient ID: Kaitlin Barton, female   DOB: May 17, 1996, 20 y.o.   MRN: [id number] KAY SHIPPY 08/19/1995 G1 P0 39w6dpresents for Nst for gestational HTN and IUGR no LOF , no vaginal bleeding , O;BP 141/89 mmHg  Pulse 63  Resp 20  Ht '5\' 3"'$  (1.6 m)  Wt 98 lb 14.4 oz (44.861 kg)  BMI 17.52 kg/m2  LMP 02/12/2015  Pt was evaluated on 10/13/15: previously pt was admitted 10/10/15 and underwent a 24hr urine protein ( normal '109mg'$ ) , CMP and cbc = nl . Labile BP Which improved with bed rest . Started on Labetalol on 10/12/15 100 mg bid . She received 2 doses of Betamethasone for FLM . Reactive NST on day of d/c . H/a improved with fioracet . [facility] . Working diagnosis: gestational htn , IUGR < 3% with normal recent fetal Doppler( 10/12/15) and reactive NST .  Today mild h/a , no vision changes and normal fetal movt . Scheduled for fetal Dopplers today   BOP today range 137-161/ 83-105 , max 161/ 105 at 1211, most recent 141/89 ABD soft NT  CX  Not checked  NST135 + accels , no decels , good variability . Reactive NST Labs: urine P/C ratio = 0.21   cbc nl , met c nl  A: gestational HTN  With increasing proteinuria on P/C ratio  May be developing superimposed PIH  IUGR [facility] YLorin Mercycontact[facility] (Catalina Antigua who wants the pt to come today for fetal doppler study  If d/c from there I have instructed the pt to do another 24 hr urine And repeat NST on Thursday

## 2015-10-16 NOTE — Progress Notes (Signed)
Per Dr. Feliberto GottronSchermerhorn request, called [facility] to see if pt can be worked in for US and doppler. Spoke with Dr. Susy FrizzleMatt, updated on pt BP's and labs, [facility] and US, doppler in AM.

## 2015-10-16 NOTE — Progress Notes (Signed)
Patient ID: Kaitlin Barton, female   DOB: [birth year], 20 y.o.   MRN: [id number] Kaitlin Barton 1996-01-11 G1 P0 37w6dpresents for Nst for gestational HTN and IUGR no LOF , no vaginal bleeding , O;BP 141/89 mmHg  Pulse 63  Resp 20  Ht '5\' 3"'  (1.6 m)  Wt 98 lb 14.4 oz (44.861 kg)  BMI 17.52 kg/m2  LMP 02/12/2015  Pt was evaluated on 10/13/15: previously pt was admitted 10/10/15 and underwent a 24hr urine protein ( normal 1074m , CMP and cbc = nl . Labile BP Which improved with bed rest . Started on Labetalol on 10/12/15 100 mg bid . She received 2 doses of Betamethasone for FLM . Reactive NST on day of d/c . H/a improved with fioracet . [facility] . Working diagnosis: gestational htn , IUGR < 3% with normal recent fetal Doppler( 10/12/15) and reactive NST .  Today mild h/a , no vision changes and normal fetal movt . Scheduled for fetal Dopplers today   BOP today range 137-161/ 83-105 , max 161/ 105 at 1211, most recent 141/89 ABD soft NT  CX  Not checked  NST135 + accels , no decels , good variability . Reactive NST Labs: urine P/C ratio = 0.21   cbc nl , met c nl  A: gestational HTN  With increasing proteinuria on P/C ratio  May be developing superimposed PIH  IUGR [facility] YaLorin Mercyontacted [facility] ( Catalina Antiguawho wants the pt to come today for fetal doppler study  If d/c from there I have instructed the pt to do another 24 hr urine And repeat NST on Thursday

## 2016-03-17 ENCOUNTER — Emergency Department: Payer: Medicaid Other

## 2016-03-17 ENCOUNTER — Emergency Department
Admission: EM | Admit: 2016-03-17 | Discharge: 2016-03-17 | Disposition: A | Discharge status: Home / Self Care | Payer: Medicaid Other | Attending: Emergency Medicine | Admitting: Emergency Medicine

## 2016-03-17 DIAGNOSIS — R1032 Left lower quadrant pain: Secondary | ICD-10-CM | POA: Diagnosis present

## 2016-03-17 DIAGNOSIS — N898 Other specified noninflammatory disorders of vagina: Secondary | ICD-10-CM | POA: Diagnosis not present

## 2016-03-17 DIAGNOSIS — K2980 Duodenitis without bleeding: Secondary | ICD-10-CM | POA: Diagnosis not present

## 2016-03-17 LAB — WET PREP, GENITAL
CLUE CELLS WET PREP: NONE SEEN
Sperm: NONE SEEN
TRICH WET PREP: NONE SEEN
Yeast Wet Prep HPF POC: NONE SEEN

## 2016-03-17 LAB — COMPREHENSIVE METABOLIC PANEL
ALBUMIN: 4.4 g/dL (ref 3.5–5.0)
ALT: 15 U/L (ref 14–54)
AST: 25 U/L (ref 15–41)
Alkaline Phosphatase: 48 U/L (ref 38–126)
Anion gap: 6 (ref 5–15)
BUN: 12 mg/dL (ref 6–20)
CHLORIDE: 108 mmol/L (ref 101–111)
CO2: 25 mmol/L (ref 22–32)
CREATININE: 0.66 mg/dL (ref 0.44–1.00)
Calcium: 9.4 mg/dL (ref 8.9–10.3)
GFR calc Af Amer: >60 mL/min (ref >60)
GLUCOSE: 96 mg/dL (ref 65–99)
Potassium: 3.4 mmol/L — ABNORMAL LOW (ref 3.5–5.1)
Sodium: 139 mmol/L (ref 135–145)
Total Bilirubin: 0.8 mg/dL (ref 0.3–1.2)
Total Protein: 7.5 g/dL (ref 6.5–8.1)

## 2016-03-17 LAB — URINALYSIS COMPLETE WITH MICROSCOPIC (ARMC ONLY)
BACTERIA UA: NONE SEEN
Bilirubin Urine: NEGATIVE
GLUCOSE, UA: NEGATIVE mg/dL
Hgb urine dipstick: NEGATIVE
Ketones, ur: NEGATIVE mg/dL
Leukocytes, UA: NEGATIVE
NITRITE: NEGATIVE
Protein, ur: NEGATIVE mg/dL
SPECIFIC GRAVITY, URINE: 1.019 (ref 1.005–1.030)
pH: 5 (ref 5.0–8.0)

## 2016-03-17 LAB — CHLAMYDIA/NGC RT PCR (ARMC ONLY)
CHLAMYDIA TR: NOT DETECTED
N gonorrhoeae: NOT DETECTED

## 2016-03-17 LAB — CBC WITH DIFFERENTIAL/PLATELET
BASOS PCT: 1 %
Basophils Absolute: 0.1 10*3/uL (ref 0–0.1)
EOS ABS: 0.1 10*3/uL (ref 0–0.7)
Eosinophils Relative: 2 %
HCT: 37 % (ref 35.0–47.0)
Hemoglobin: 12.1 g/dL (ref 12.0–16.0)
Lymphocytes Relative: 46 %
Lymphs Abs: 3 10*3/uL (ref 1.0–3.6)
MCH: 27.6 pg (ref 26.0–34.0)
MCHC: 32.7 g/dL (ref 32.0–36.0)
MCV: 84.2 fL (ref 80.0–100.0)
MONO ABS: 0.4 10*3/uL (ref 0.2–0.9)
MONOS PCT: 7 %
Neutro Abs: 2.8 10*3/uL (ref 1.4–6.5)
Neutrophils Relative %: 44 %
Platelets: 203 10*3/uL (ref 150–440)
RBC: 4.39 MIL/uL (ref 3.80–5.20)
RDW: 14 % (ref 11.5–14.5)
WBC: 6.4 10*3/uL (ref 3.6–11.0)

## 2016-03-17 LAB — POCT PREGNANCY, URINE: Preg Test, Ur: NEGATIVE

## 2016-03-17 LAB — HCG, QUANTITATIVE, PREGNANCY

## 2016-03-17 MED ORDER — IOPAMIDOL (ISOVUE-300) INJECTION 61%
100.0000 mL | Freq: Once | INTRAVENOUS | Status: AC | PRN
  Administered 2016-03-17: 100 mL via INTRAVENOUS

## 2016-03-17 MED ORDER — CIPROFLOXACIN HCL 500 MG PO TABS: 500.0000 mg | ORAL_TABLET | Freq: Two times a day (BID) | ORAL | 0 refills | Status: AC

## 2016-03-17 MED ORDER — SODIUM CHLORIDE 0.9 % IV BOLUS (SEPSIS)
1000.0000 mL | Freq: Once | INTRAVENOUS | Status: AC
  Administered 2016-03-17: 1000 mL via INTRAVENOUS

## 2016-03-17 MED ORDER — CIPROFLOXACIN HCL 500 MG PO TABS
500.0000 mg | ORAL_TABLET | Freq: Once | ORAL | Status: AC
  Administered 2016-03-17: 500 mg via ORAL
  Filled 2016-03-17: qty 1

## 2016-03-17 MED ORDER — METRONIDAZOLE 500 MG PO TABS
500.0000 mg | ORAL_TABLET | Freq: Once | ORAL | Status: AC
  Administered 2016-03-17: 500 mg via ORAL
  Filled 2016-03-17: qty 1

## 2016-03-17 MED ORDER — IOPAMIDOL (ISOVUE-300) INJECTION 61%
30.0000 mL | Freq: Once | INTRAVENOUS | Status: AC
  Administered 2016-03-17: 30 mL via ORAL

## 2016-03-17 MED ORDER — MORPHINE SULFATE (PF) 4 MG/ML IV SOLN
4.0000 mg | Freq: Once | INTRAVENOUS | Status: AC
  Administered 2016-03-17: 4 mg via INTRAVENOUS
  Filled 2016-03-17: qty 1

## 2016-03-17 MED ORDER — ONDANSETRON HCL 4 MG/2ML IJ SOLN
4.0000 mg | Freq: Once | INTRAMUSCULAR | Status: AC
  Administered 2016-03-17: 4 mg via INTRAVENOUS
  Filled 2016-03-17: qty 2

## 2016-03-17 MED ORDER — METRONIDAZOLE 500 MG PO TABS: 500.0000 mg | ORAL_TABLET | Freq: Three times a day (TID) | ORAL | 0 refills | Status: AC

## 2016-03-17 NOTE — ED Triage Notes (Signed)
Patient woke up this morning with abdominal pain. Points to the lower portion of her abdomen. Denies dysuria or lower back pain.

## 2016-03-17 NOTE — ED Notes (Signed)
Pt unable to give urine sample in triage.  Given spec cup and she is aware of need for urine

## 2016-03-17 NOTE — ED Provider Notes (Signed)
[facility] Emergency Department Provider Note  ____________________________________________  Time seen: Approximately 10:27 AM  I have reviewed the triage vital signs and the nursing notes.   HISTORY  Chief Complaint Abdominal Pain   HPI Vilinda BoehringerLacey D Gardin is a 20 y.o. female no significant past medical history who presents for evaluation of abdominal pain. Patient is 4 months postpartum from vaginal delivery of an infant and was induced due to preeclampsia. She was doing well to yesterday evening. She reports that she woke up at 3 AM this morning with severe left-sided abdominal pain that she describes as sharp, constant, associated with nausea. She denies vomiting, fever, chills, chest pain, shortness of breath, vaginal discharge, dysuria, hematuria, constipation. She denies ever having similar pain before. She denies any prior abdominal surgeries. She hasn't tried anything for the pain. She currently reports her pain is 8 out of 10, located in the left lower quadrant, nonradiating.  Past Medical History:  Diagnosis Date  . Chronic tension headaches   . TMJ (dislocation of temporomandibular joint)     Patient Active Problem List   Diagnosis Date Noted  . Indication for care in labor and delivery, antepartum 10/16/2015  . Pregnancy affected by fetal growth restriction 10/12/2015  . Preterm contractions 08/30/2015    Past Surgical History:  Procedure Laterality Date  . INNER EAR SURGERY      Prior to Admission medications   Medication Sig Start Date End Date Taking? Authorizing Provider  ondansetron (ZOFRAN) 4 MG tablet Take 1 tablet (4 mg total) by mouth every 8 (eight) hours as needed for nausea or vomiting. 03/04/15  Yes Jennye MoccasinBrian S Quigley, MD  ciprofloxacin (CIPRO) 500 MG tablet Take 1 tablet (500 mg total) by mouth 2 (two) times daily. 03/17/16 03/24/16  Nita Sicklearolina Lalaine Overstreet, MD  metroNIDAZOLE (FLAGYL) 500 MG tablet Take 1 tablet (500 mg total) by mouth 3  (three) times daily. 03/17/16 03/24/16  Nita Sicklearolina Izzy Courville, MD    Allergies Sulfa antibiotics  No family history on file.  Social History Social History  Substance Use Topics  . Smoking status: Never Smoker  . Smokeless tobacco: Not on file  . Alcohol use No    Review of Systems Constitutional: Negative for fever. Eyes: Negative for visual changes. ENT: Negative for sore throat. Cardiovascular: Negative for chest pain. Respiratory: Negative for shortness of breath. Gastrointestinal: + LLQ abdominal pain and nausea. No vomiting or diarrhea. Genitourinary: Negative for dysuria. Musculoskeletal: Negative for back pain. Skin: Negative for rash. Neurological: Negative for headaches, weakness or numbness.  ____________________________________________   PHYSICAL EXAM:  VITAL SIGNS: ED Triage Vitals  Enc Vitals Group     BP 03/17/16 0506 121/81     Pulse Rate 03/17/16 0506 (!) 121     Resp 03/17/16 0506 16     Temp 03/17/16 0506 98 F (36.7 C)     Temp Source 03/17/16 0506 Oral     SpO2 03/17/16 0506 100 %     Weight 03/17/16 0502 97 lb (44 kg)     Height 03/17/16 0502 5\' 3"  (1.6 m)     Head Circumference --      Peak Flow --      Pain Score 03/17/16 0502 9     Pain Loc --      Pain Edu? --      Excl. in GC? --     Constitutional: Alert and oriented. Well appearing and in no apparent distress. HEENT:      Head: Normocephalic  and atraumatic.         Eyes: Conjunctivae are normal. Sclera is non-icteric. EOMI. PERRL      Mouth/Throat: Mucous membranes are moist.       Neck: Supple with no signs of meningismus. Cardiovascular: Regular rate and rhythm. No murmurs, gallops, or rubs. 2+ symmetrical distal pulses are present in all extremities. No JVD. Respiratory: Normal respiratory effort. Lungs are clear to auscultation bilaterally. No wheezes, crackles, or rhonchi.  Gastrointestinal: Soft, ttp over the LLQ with localized guarding, and non distended with positive bowel  sounds. No rebound or guarding. Pelvic exam: Normal external genitalia, no rashes or lesions. Normal cervical mucus. Os closed with erythematous lesion surrounding the os. No cervical motion tenderness.  No uterine or adnexal tenderness.   Musculoskeletal: Nontender with normal range of motion in all extremities. No edema, cyanosis, or erythema of extremities. Neurologic: Normal speech and language. Face is symmetric. Moving all extremities. No gross focal neurologic deficits are appreciated. Skin: Skin is warm, dry and intact. No rash noted. Psychiatric: Mood and affect are normal. Speech and behavior are normal.  ____________________________________________   LABS (all labs ordered are listed, but only abnormal results are displayed)  Labs Reviewed  WET PREP, GENITAL - Abnormal; Notable for the following:       Result Value   WBC, Wet Prep HPF POC MODERATE (*)    All other components within normal limits  COMPREHENSIVE METABOLIC PANEL - Abnormal; Notable for the following:    Potassium 3.4 (*)    All other components within normal limits  URINALYSIS COMPLETEWITH MICROSCOPIC ([facility] ONLY) - Abnormal; Notable for the following:    Color, Urine YELLOW (*)    APPearance CLEAR (*)    Squamous Epithelial / LPF 0-5 (*)    All other components within normal limits  CHLAMYDIA/NGC RT PCR ([facility] ONLY)  CBC WITH DIFFERENTIAL/PLATELET  HCG, QUANTITATIVE, PREGNANCY  POC URINE PREG, ED  POCT PREGNANCY, URINE   ____________________________________________  EKG  none ____________________________________________  RADIOLOGY  Pelvic US: Normal pelvic ultrasound. No ovarian torsion   CT a/p: Mild wall thickening in the proximal to mid duodenum regions. Suspect a degree of duodenitis. No other bowel wall thickening. No bowel obstruction. No abscess. Rectum and distal descending colon are mildly distended with stool. No diverticulitis evident.  Appendix appears normal. No  abscess.  No renal or ureteral calculus. No hydronephrosis. ____________________________________________   PROCEDURES  Procedure(s) performed: None Procedures Critical Care performed:  None ____________________________________________   INITIAL IMPRESSION / ASSESSMENT AND PLAN / ED COURSE  20 y.o. female no significant past medical history who presents for evaluation of abdominal pain. Initial vital signs showed tachycardia with heart rate in the 120s, afebrile, remaining of her vital signs are within normal limits. Patient with tenderness to palpation in the left lower quadrant with localized guarding. Pelvic ultrasound showing a lesion around the os but no evidence of PID. Transvaginal ultrasound showing normal adnexa with no evidence of torsion. Patient was given morphine, IV fluids, and Zofran with improvement of her pain. Continues to have tenderness in the left lower quadrant. Lab work within normal limits. CT abdomen and pelvis has been ordered. I discussed with patient the findings of my pelvic exam a recommended follow-up with OB/GYN for a biopsy to rule out cervical cancer.  Clinical Course  Comment By Time  CT concerning for duodenitis. VS improved with IVF. Patient with mild tenderness in the left quadrants with no rebound or guarding. We'll discharge home on Cipro  and Flagyl with close follow-up with primary care doctor. Nita Sickle, MD 10/02 1326    Pertinent labs & imaging results that were available during my care of the patient were reviewed by me and considered in my medical decision making (see chart for details).    ____________________________________________   FINAL CLINICAL IMPRESSION(S) / ED DIAGNOSES  Final diagnoses:  Left lower quadrant pain  Duodenitis      NEW MEDICATIONS STARTED DURING THIS VISIT:  New Prescriptions   CIPROFLOXACIN (CIPRO) 500 MG TABLET    Take 1 tablet (500 mg total) by mouth 2 (two) times daily.   METRONIDAZOLE  (FLAGYL) 500 MG TABLET    Take 1 tablet (500 mg total) by mouth 3 (three) times daily.     Note:  This document was prepared using Dragon voice recognition software and may include unintentional dictation errors.    Nita Sickle, MD 03/17/16 1330

## 2016-03-17 NOTE — Discharge Instructions (Signed)

## 2017-05-10 ENCOUNTER — Emergency Department
Admission: EM | Admit: 2017-05-10 | Discharge: 2017-05-10 | Disposition: A | Discharge status: Home / Self Care | Payer: Medicaid Other | Attending: Emergency Medicine | Admitting: Emergency Medicine

## 2017-05-10 ENCOUNTER — Encounter: Payer: Self-pay | Admitting: Emergency Medicine

## 2017-05-10 ENCOUNTER — Other Ambulatory Visit: Payer: Self-pay

## 2017-05-10 DIAGNOSIS — M25562 Pain in left knee: Secondary | ICD-10-CM | POA: Insufficient documentation

## 2017-05-10 DIAGNOSIS — M25561 Pain in right knee: Secondary | ICD-10-CM | POA: Insufficient documentation

## 2017-05-10 MED ORDER — NAPROXEN 375 MG PO TABS: 375.0000 mg | ORAL_TABLET | Freq: Two times a day (BID) | ORAL | 0 refills | Status: AC

## 2017-05-10 NOTE — ED Triage Notes (Addendum)
Pt presents to ED, states hx of "knee problems". Pt states recently started a new job where she is on her feet a lot and walking. States 1 week ago began having R knee pain, now is having both R and L knee pain at this time. Denies any known injury.

## 2017-05-10 NOTE — ED Notes (Signed)
NAD noted at time of D/C. Pt denies questions or concerns. Pt ambulatory to the lobby at this time.  

## 2017-05-10 NOTE — Discharge Instructions (Signed)
Wear good supportive shoes while at work. They need to have especially good arch support which will help with your  knees. Take naproxen 375 mg twice a day with food. Follow-up with your primary care provider if any continued problems with her knees.

## 2017-05-10 NOTE — ED Provider Notes (Signed)
[facility] Emergency Department Provider Note  ____________________________________________   First MD Initiated Contact with Patient 05/10/17 1438     (approximate)  I have reviewed the triage vital signs and the nursing notes.   HISTORY  Chief Complaint Knee Pain   HPI Kaitlin Barton is a 21 y.o. female for complaint of bilateral knee pain. Patient states that she recently started a new job where she stands on her feet a lot and does a lot walking. She denies any injury. She states that initially her right knee began hurting but now it is both. She has been taking occasional Tylenol without any relief. She denies any health problems. She denies any problems with anti-inflammatories. She  rates her pain as an 8 out of 10.   Past Medical History:  Diagnosis Date  . Chronic tension headaches   . TMJ (dislocation of temporomandibular joint)     Patient Active Problem List   Diagnosis Date Noted  . Indication for care in labor and delivery, antepartum 10/16/2015  . Pregnancy affected by fetal growth restriction 10/12/2015  . Preterm contractions 08/30/2015    Past Surgical History:  Procedure Laterality Date  . INNER EAR SURGERY      Prior to Admission medications   Medication Sig Start Date End Date Taking? Authorizing Provider  naproxen (NAPROSYN) 375 MG tablet Take 1 tablet (375 mg total) by mouth 2 (two) times daily with a meal. 05/10/17 05/10/18  Bridget HartshornSummers, Rhonda L, PA-C  ondansetron (ZOFRAN) 4 MG tablet Take 1 tablet (4 mg total) by mouth every 8 (eight) hours as needed for nausea or vomiting. 03/04/15   Jennye MoccasinQuigley, Brian S, MD    Allergies Sulfa antibiotics  History reviewed. No pertinent family history.  Social History Social History   Tobacco Use  . Smoking status: Never Smoker  . Smokeless tobacco: Never Used  Substance Use Topics  . Alcohol use: No    Alcohol/week: 0.0 oz  . Drug use: No    Review of Systems Constitutional: No  fever/chills Cardiovascular: Denies chest pain. Respiratory: Denies shortness of breath. Gastrointestinal:  No nausea, no vomiting. Genitourinary: Negative for dysuria. Musculoskeletal: positive for bilateral knee pain. Skin: negative for erythema. Neurological: Negative for headaches, focal weakness or numbness. ___________________________________________   PHYSICAL EXAM:  VITAL SIGNS: ED Triage Vitals [05/10/17 1353]  Enc Vitals Group     BP 129/80     Pulse Rate 100     Resp 18     Temp 98.4 F (36.9 C)     Temp Source Oral     SpO2 100 %     Weight 89 lb (40.4 kg)     Height 5\' 3"  (1.6 m)     Head Circumference      Peak Flow      Pain Score 8     Pain Loc      Pain Edu?      Excl. in GC?    Constitutional: Alert and oriented. Well appearing and in no acute distress. Eyes: Conjunctivae are normal.  Head: Atraumatic. Neck: No stridor.   Cardiovascular: Normal rate, regular rhythm. Grossly normal heart sounds.  Good peripheral circulation. Respiratory: Normal respiratory effort.  No retractions. Lungs CTAB. Musculoskeletal: bilateral knees are without effusion, deformity or erythema. There is no warmth or point tenderness. No crepitus is noted with range of motion.No restriction. Neurologic:  Normal speech and language. No gross focal neurologic deficits are appreciated. No gait instability. Skin:  Skin is  warm, dry and intact. No abrasions, erythema or ecchymosis noted. Psychiatric: Mood and affect are normal. Speech and behavior are normal.  ____________________________________________   LABS (all labs ordered are listed, but only abnormal results are displayed)  Labs Reviewed - No data to display  RADIOLOGY  Deferred. ____________________________________________   PROCEDURES  Procedure(s) performed: None  Procedures  Critical Care performed: No  ____________________________________________   INITIAL IMPRESSION / ASSESSMENT AND PLAN / ED  COURSE Patient with history of bilateral knee pain for one week without injury. Patient was given a prescription for naproxen 375 mg 1 twice a day with food. She is to follow-up with her PCP if any continued problems. She is also to wear good supportive shoes while at work to help with her knee pain.   ____________________________________________   FINAL CLINICAL IMPRESSION(S) / ED DIAGNOSES  Final diagnoses:  Acute pain of both knees     ED Discharge Orders        Ordered    naproxen (NAPROSYN) 375 MG tablet  2 times daily with meals     05/10/17 1504       Note:  This document was prepared using Dragon voice recognition software and may include unintentional dictation errors.    Tommi RumpsSummers, Rhonda L, PA-C 05/10/17 1508    Dionne BucySiadecki, Sebastian, MD 05/10/17 1537

## 2017-05-13 ENCOUNTER — Encounter: Payer: Self-pay | Admitting: Emergency Medicine

## 2017-05-13 ENCOUNTER — Emergency Department
Admission: EM | Admit: 2017-05-13 | Discharge: 2017-05-13 | Disposition: A | Discharge status: Home / Self Care | Payer: Medicaid Other | Attending: Emergency Medicine | Admitting: Emergency Medicine

## 2017-05-13 ENCOUNTER — Other Ambulatory Visit: Payer: Self-pay

## 2017-05-13 ENCOUNTER — Emergency Department: Payer: Medicaid Other

## 2017-05-13 DIAGNOSIS — J40 Bronchitis, not specified as acute or chronic: Secondary | ICD-10-CM

## 2017-05-13 DIAGNOSIS — J209 Acute bronchitis, unspecified: Secondary | ICD-10-CM | POA: Insufficient documentation

## 2017-05-13 DIAGNOSIS — D649 Anemia, unspecified: Secondary | ICD-10-CM | POA: Insufficient documentation

## 2017-05-13 DIAGNOSIS — R0981 Nasal congestion: Secondary | ICD-10-CM | POA: Diagnosis present

## 2017-05-13 DIAGNOSIS — E876 Hypokalemia: Secondary | ICD-10-CM | POA: Diagnosis not present

## 2017-05-13 HISTORY — DX: Anxiety disorder, unspecified: F41.9

## 2017-05-13 HISTORY — DX: Major depressive disorder, single episode, unspecified: F32.9

## 2017-05-13 HISTORY — DX: Depression, unspecified: F32.A

## 2017-05-13 LAB — URINALYSIS, COMPLETE (UACMP) WITH MICROSCOPIC
BILIRUBIN URINE: NEGATIVE
Bacteria, UA: NONE SEEN
GLUCOSE, UA: NEGATIVE mg/dL
HGB URINE DIPSTICK: NEGATIVE
KETONES UR: NEGATIVE mg/dL
Leukocytes, UA: NEGATIVE
Nitrite: NEGATIVE
PH: 6 (ref 5.0–8.0)
Protein, ur: 30 mg/dL — AB
SPECIFIC GRAVITY, URINE: 1.019 (ref 1.005–1.030)

## 2017-05-13 LAB — BASIC METABOLIC PANEL
Anion gap: 11 (ref 5–15)
BUN: 10 mg/dL (ref 6–20)
CALCIUM: 9.3 mg/dL (ref 8.9–10.3)
CHLORIDE: 101 mmol/L (ref 101–111)
CO2: 26 mmol/L (ref 22–32)
CREATININE: 0.65 mg/dL (ref 0.44–1.00)
GFR calc Af Amer: >60 mL/min (ref >60)
GFR calc non Af Amer: >60 mL/min (ref >60)
Glucose, Bld: 90 mg/dL (ref 65–99)
Potassium: 3.3 mmol/L — ABNORMAL LOW (ref 3.5–5.1)
SODIUM: 138 mmol/L (ref 135–145)

## 2017-05-13 LAB — CBC
HCT: 31.9 % — ABNORMAL LOW (ref 35.0–47.0)
Hemoglobin: 10.2 g/dL — ABNORMAL LOW (ref 12.0–16.0)
MCH: 25.3 pg — AB (ref 26.0–34.0)
MCHC: 32.1 g/dL (ref 32.0–36.0)
MCV: 78.8 fL — ABNORMAL LOW (ref 80.0–100.0)
PLATELETS: 452 10*3/uL — AB (ref 150–440)
RBC: 4.05 MIL/uL (ref 3.80–5.20)
RDW: 15.3 % — ABNORMAL HIGH (ref 11.5–14.5)
WBC: 10.1 10*3/uL (ref 3.6–11.0)

## 2017-05-13 LAB — POCT PREGNANCY, URINE: Preg Test, Ur: NEGATIVE

## 2017-05-13 MED ORDER — PREDNISONE 10 MG PO TABS: ORAL_TABLET | ORAL | 0 refills | Status: DC

## 2017-05-13 MED ORDER — AZITHROMYCIN 200 MG/5ML PO SUSR: 250.0000 mg | Freq: Every day | ORAL | 0 refills | Status: DC

## 2017-05-13 MED ORDER — AZITHROMYCIN 200 MG/5ML PO SUSR
ORAL | Status: AC
  Filled 2017-05-13: qty 10

## 2017-05-13 MED ORDER — ALBUTEROL SULFATE HFA 108 (90 BASE) MCG/ACT IN AERS: 2.0000 | INHALATION_SPRAY | Freq: Four times a day (QID) | RESPIRATORY_TRACT | 0 refills | Status: DC | PRN

## 2017-05-13 MED ORDER — POTASSIUM CHLORIDE CRYS ER 20 MEQ PO TBCR
40.0000 meq | EXTENDED_RELEASE_TABLET | Freq: Once | ORAL | Status: AC
  Administered 2017-05-13: 40 meq via ORAL
  Filled 2017-05-13: qty 2

## 2017-05-13 MED ORDER — BENZONATATE 100 MG PO CAPS: 100.0000 mg | ORAL_CAPSULE | Freq: Three times a day (TID) | ORAL | 0 refills | Status: AC | PRN

## 2017-05-13 MED ORDER — AZITHROMYCIN 200 MG/5ML PO SUSR: ORAL | 0 refills | Status: DC

## 2017-05-13 MED ORDER — AZITHROMYCIN 200 MG/5ML PO SUSR: 500.0000 mg | Freq: Once | ORAL | Status: DC

## 2017-05-13 MED ORDER — IRON 325 (65 FE) MG PO TABS: 1.0000 | ORAL_TABLET | Freq: Three times a day (TID) | ORAL | 0 refills | Status: DC

## 2017-05-13 NOTE — ED Provider Notes (Signed)
[facility] Emergency Department Provider Note  ____________________________________________  Time seen: Approximately 9:19 PM  I have reviewed the triage vital signs and the nursing notes.   HISTORY  Chief Complaint Nasal Congestion and URI    HPI Kaitlin Barton is a 21 y.o. female that presents to the emergency department for evaluation of an episode nausea and dizziness yesterday and cough for 2 weeks.  Patient states that she was walking at work when she started to feel nauseous.  She went into the bathroom but was unable to vomit.  She continued to feel dizzy and hot for several minutes.  She states that she is unsure if this is related to her anxiety or stress at work.  She has not had any similar symptoms today.  She has also had a nonproductive cough for 2 weeks. She does not smoke. No sick contacts. She states that she is unable to swallow antibiotics but can swallow other pills. No fever, shortness of breath, chest pain, vomiting, abdominal pain.  Past Medical History:  Diagnosis Date  . Anxiety   . Chronic tension headaches   . Depression   . TMJ (dislocation of temporomandibular joint)     Patient Active Problem List   Diagnosis Date Noted  . Indication for care in labor and delivery, antepartum 10/16/2015  . Pregnancy affected by fetal growth restriction 10/12/2015  . Preterm contractions 08/30/2015    Past Surgical History:  Procedure Laterality Date  . INNER EAR SURGERY      Prior to Admission medications   Medication Sig Start Date End Date Taking? Authorizing Provider  albuterol (PROVENTIL HFA;VENTOLIN HFA) 108 (90 Base) MCG/ACT inhaler Inhale 2 puffs into the lungs every 6 (six) hours as needed for wheezing or shortness of breath. 05/13/17   Enid DerryWagner, Donella Pascarella, PA-C  azithromycin (ZITHROMAX) 200 MG/5ML suspension Take 12.496mL (500mg ) on day 1. Take 6.113mL (250mg ) on day 2-5. 05/13/17   Enid DerryWagner, Dailynn Nancarrow, PA-C  benzonatate (TESSALON PERLES) 100 MG  capsule Take 1 capsule (100 mg total) by mouth 3 (three) times daily as needed for cough. 05/13/17 05/13/18  Enid DerryWagner, Teren Zurcher, PA-C  Ferrous Sulfate (IRON) 325 (65 Fe) MG TABS Take 1 tablet (325 mg total) by mouth 3 (three) times daily. 05/13/17   Enid DerryWagner, Rozanna Cormany, PA-C  naproxen (NAPROSYN) 375 MG tablet Take 1 tablet (375 mg total) by mouth 2 (two) times daily with a meal. 05/10/17 05/10/18  Bridget HartshornSummers, Rhonda L, PA-C  ondansetron (ZOFRAN) 4 MG tablet Take 1 tablet (4 mg total) by mouth every 8 (eight) hours as needed for nausea or vomiting. 03/04/15   Jennye MoccasinQuigley, Brian S, MD  predniSONE (DELTASONE) 10 MG tablet Take 6 tablets on day 1, take 5 tablets on day 2, take 4 tablets on day 3, take 3 tablets on day 4, take 2 tablets on day 5, take 1 tablet on day 6 05/13/17   Enid DerryWagner, Tayona Sarnowski, PA-C    Allergies Sulfa antibiotics  History reviewed. No pertinent family history.  Social History Social History   Tobacco Use  . Smoking status: Never Smoker  . Smokeless tobacco: Never Used  Substance Use Topics  . Alcohol use: No    Alcohol/week: 0.0 oz  . Drug use: No     Review of Systems  Constitutional: No fever/chills Eyes: No visual changes. No discharge. ENT: Negative for congestion and rhinorrhea. Cardiovascular: No chest pain. Respiratory: Positive for cough. No SOB. Gastrointestinal: No abdominal pain.  No vomiting.  No diarrhea.  No constipation. Musculoskeletal:  Negative for musculoskeletal pain. Skin: Negative for rash, abrasions, lacerations, ecchymosis. Neurological: Negative for headaches.   ____________________________________________   PHYSICAL EXAM:  VITAL SIGNS: ED Triage Vitals  Enc Vitals Group     BP 05/13/17 1941 119/76     Pulse Rate 05/13/17 1941 96     Resp 05/13/17 1941 20     Temp 05/13/17 1941 97.8 F (36.6 C)     Temp Source 05/13/17 1941 Oral     SpO2 05/13/17 1941 100 %     Weight 05/13/17 1941 89 lb (40.4 kg)     Height 05/13/17 1941 5\' 3"  (1.6 m)      Head Circumference --      Peak Flow --      Pain Score 05/13/17 1947 0     Pain Loc --      Pain Edu? --      Excl. in GC? --      Constitutional: Alert and oriented. Well appearing and in no acute distress. Eyes: Conjunctivae are normal. PERRL. EOMI. No discharge. Head: Atraumatic. ENT: No frontal and maxillary sinus tenderness.      Ears: Tympanic membranes pearly gray with good landmarks. No discharge.      Nose: No congestion/rhinnorhea.      Mouth/Throat: Mucous membranes are moist. Oropharynx non-erythematous. Tonsils not enlarged. No exudates. Uvula midline. Neck: No stridor.   Hematological/Lymphatic/Immunilogical: No cervical lymphadenopathy. Cardiovascular: Normal rate, regular rhythm.  Good peripheral circulation. Respiratory: Normal respiratory effort without tachypnea or retractions. Lungs CTAB. Good air entry to the bases with no decreased or absent breath sounds. Gastrointestinal: Bowel sounds 4 quadrants. Soft and nontender to palpation. No guarding or rigidity. No palpable masses. No distention. Musculoskeletal: Full range of motion to all extremities. No gross deformities appreciated. Neurologic:  Normal speech and language. No gross focal neurologic deficits are appreciated.  Skin:  Skin is warm, dry and intact. No rash noted.   ____________________________________________   LABS (all labs ordered are listed, but only abnormal results are displayed)  Labs Reviewed  CBC - Abnormal; Notable for the following components:      Result Value   Hemoglobin 10.2 (*)    HCT 31.9 (*)    MCV 78.8 (*)    MCH 25.3 (*)    RDW 15.3 (*)    Platelets 452 (*)    All other components within normal limits  BASIC METABOLIC PANEL - Abnormal; Notable for the following components:   Potassium 3.3 (*)    All other components within normal limits  URINALYSIS, COMPLETE (UACMP) WITH MICROSCOPIC - Abnormal; Notable for the following components:   Color, Urine YELLOW (*)     APPearance HAZY (*)    Protein, ur 30 (*)    Squamous Epithelial / LPF 0-5 (*)    All other components within normal limits  POC URINE PREG, ED  POCT PREGNANCY, URINE   ____________________________________________  EKG   ____________________________________________  RADIOLOGY Lexine Baton, personally viewed and evaluated these images (plain radiographs) as part of my medical decision making, as well as reviewing the written report by the radiologist.  Dg Chest 2 View  Result Date: 05/13/2017 CLINICAL DATA:  Cough and congestion EXAM: CHEST  2 VIEW COMPARISON:  05/30/2014 FINDINGS: Mild central airways thickening and hyperinflation. No focal infiltrate or effusion. Normal heart size. No pneumothorax. IMPRESSION: Central airways thickening suggesting bronchitis or reactive airways. No focal infiltrate. Electronically Signed   By: Jasmine Pang M.D.   On: 05/13/2017 20:23  ____________________________________________    PROCEDURES  Procedure(s) performed:    Procedures    Medications  potassium chloride SA (K-DUR,KLOR-CON) CR tablet 40 mEq (40 mEq Oral Given 05/13/17 2228)     ____________________________________________   INITIAL IMPRESSION / ASSESSMENT AND PLAN / ED COURSE  Pertinent labs & imaging results that were available during my care of the patient were reviewed by me and considered in my medical decision making (see chart for details).  Review of the [place] CSRS was performed in accordance of the [facility] prior to dispensing any controlled drugs.   Patient's diagnosis is consistent with bronchitis, anemia, hypokalemia. Vital signs and exam are reassuring.  Chest x-ray consistent with bronchitis.  CBC consistent with anemia.  Patient states that she has "been anemic for as long as I can remember." She has taken iron pills in the past for this. She will be given a prescription for iron. Patient was given a dose of potassium for mild hypokalemia. Urinalysis  negative for infection. Pregnancy negative.  Education was provided.  Patient feels comfortable going home. Patient will be discharged home with prescriptions for azithromycin, prednisone, iron, tessalon pearls, albuterol. Patient is to follow up with PCP as needed or otherwise directed. Patient is given ED precautions to return to the ED for any worsening or new symptoms.     ____________________________________________  FINAL CLINICAL IMPRESSION(S) / ED DIAGNOSES  Final diagnoses:  Bronchitis  Anemia, unspecified type  Hypokalemia        This chart was dictated using voice recognition software/Dragon. Despite best efforts to proofread, errors can occur which can change the meaning. Any change was purely unintentional.    Enid DerryWagner, Leiah Giannotti, PA-C 05/14/17 1605    Sharman CheekStafford, Phillip, MD 05/17/17 2312

## 2017-05-13 NOTE — ED Triage Notes (Signed)
Pt to ED c/o congestion and cough for 2 weeks.  Chest rise even and unlabored, skin warm and dry in triage.

## 2017-07-08 ENCOUNTER — Emergency Department
Admission: EM | Admit: 2017-07-08 | Discharge: 2017-07-08 | Disposition: A | Discharge status: Home / Self Care | Payer: Medicaid Other | Attending: Emergency Medicine | Admitting: Emergency Medicine

## 2017-07-08 ENCOUNTER — Other Ambulatory Visit: Payer: Self-pay

## 2017-07-08 DIAGNOSIS — R51 Headache: Secondary | ICD-10-CM | POA: Diagnosis present

## 2017-07-08 DIAGNOSIS — F419 Anxiety disorder, unspecified: Secondary | ICD-10-CM | POA: Diagnosis not present

## 2017-07-08 DIAGNOSIS — G44209 Tension-type headache, unspecified, not intractable: Secondary | ICD-10-CM | POA: Diagnosis not present

## 2017-07-08 DIAGNOSIS — F329 Major depressive disorder, single episode, unspecified: Secondary | ICD-10-CM | POA: Insufficient documentation

## 2017-07-08 DIAGNOSIS — Z79899 Other long term (current) drug therapy: Secondary | ICD-10-CM | POA: Diagnosis not present

## 2017-07-08 LAB — POCT PREGNANCY, URINE: Preg Test, Ur: NEGATIVE

## 2017-07-08 MED ORDER — BUTALBITAL-APAP-CAFFEINE 50-325-40 MG PO TABS: 1.0000 | ORAL_TABLET | Freq: Four times a day (QID) | ORAL | 0 refills | Status: AC | PRN

## 2017-07-08 MED ORDER — BUTALBITAL-APAP-CAFFEINE 50-325-40 MG PO TABS
ORAL_TABLET | ORAL | Status: AC
  Filled 2017-07-08: qty 1

## 2017-07-08 MED ORDER — BUTALBITAL-APAP-CAFFEINE 50-325-40 MG PO TABS
1.0000 | ORAL_TABLET | Freq: Once | ORAL | Status: AC
  Administered 2017-07-08: 1 via ORAL

## 2017-07-08 NOTE — ED Triage Notes (Signed)
Pt reports headache that started las tonight, hx of such. Pt took naproxen this AM without relief. NV X 1 this AM. Denies NVD now. Photosensitivity and noise sensitivity. Pt alert and oriented X4, active, cooperative, pt in NAD. RR even and unlabored, color WNL.

## 2017-07-08 NOTE — ED Provider Notes (Signed)
[facility] Emergency Department Provider Note   ____________________________________________   I have reviewed the triage vital signs and the nursing notes.   HISTORY  Chief Complaint Headache   History limited by: Not Limited   HPI Kaitlin Barton is a 22 y.o. female who presents to the emergency department today because of concern for headache. It started yesterday. It has been persistent. It is severe. The patient has tried naproxen without relief. Bright lights make it worse. The patient states she has a history of tension headaches that usual improve with naproxen. She thinks she might have waited too long to take the medication this time however. She denies any trauma to her head.   Per medical record review patient has a history of tension headaches.  Past Medical History:  Diagnosis Date  . Anxiety   . Chronic tension headaches   . Depression   . TMJ (dislocation of temporomandibular joint)     Patient Active Problem List   Diagnosis Date Noted  . Indication for care in labor and delivery, antepartum 10/16/2015  . Pregnancy affected by fetal growth restriction 10/12/2015  . Preterm contractions 08/30/2015    Past Surgical History:  Procedure Laterality Date  . INNER EAR SURGERY      Prior to Admission medications   Medication Sig Start Date End Date Taking? Authorizing Provider  albuterol (PROVENTIL HFA;VENTOLIN HFA) 108 (90 Base) MCG/ACT inhaler Inhale 2 puffs into the lungs every 6 (six) hours as needed for wheezing or shortness of breath. 05/13/17   Enid Derry, PA-C  azithromycin (ZITHROMAX) 200 MG/5ML suspension Take 12.61mL (500mg ) on day 1. Take 6.12mL (250mg ) on day 2-5. 05/13/17   Enid Derry, PA-C  benzonatate (TESSALON PERLES) 100 MG capsule Take 1 capsule (100 mg total) by mouth 3 (three) times daily as needed for cough. 05/13/17 05/13/18  Enid Derry, PA-C  Ferrous Sulfate (IRON) 325 (65 Fe) MG TABS Take 1 tablet (325 mg  total) by mouth 3 (three) times daily. 05/13/17   Enid Derry, PA-C  naproxen (NAPROSYN) 375 MG tablet Take 1 tablet (375 mg total) by mouth 2 (two) times daily with a meal. 05/10/17 05/10/18  Bridget Hartshorn L, PA-C  ondansetron (ZOFRAN) 4 MG tablet Take 1 tablet (4 mg total) by mouth every 8 (eight) hours as needed for nausea or vomiting. 03/04/15   Jennye Moccasin, MD  predniSONE (DELTASONE) 10 MG tablet Take 6 tablets on day 1, take 5 tablets on day 2, take 4 tablets on day 3, take 3 tablets on day 4, take 2 tablets on day 5, take 1 tablet on day 6 05/13/17   Enid Derry, PA-C    Allergies Sulfa antibiotics  No family history on file.  Social History Social History   Tobacco Use  . Smoking status: Never Smoker  . Smokeless tobacco: Never Used  Substance Use Topics  . Alcohol use: No    Alcohol/week: 0.0 oz  . Drug use: No    Review of Systems Constitutional: No fever/chills Eyes: No visual changes. ENT: No sore throat. Cardiovascular: Denies chest pain. Respiratory: Denies shortness of breath. Gastrointestinal: No abdominal pain.  No nausea, no vomiting.  No diarrhea.   Genitourinary: Negative for dysuria. Musculoskeletal: Negative for back pain. Skin: Negative for rash. Neurological: Positive for headache.  ____________________________________________   PHYSICAL EXAM:  VITAL SIGNS: ED Triage Vitals [07/08/17 1748]  Enc Vitals Group     BP 113/83     Pulse Rate 77  Resp 18     Temp 98.8 F (37.1 C)     Temp Source Oral     SpO2 98 %     Weight 89 lb (40.4 kg)     Height 5\' 3"  (1.6 m)     Head Circumference      Peak Flow      Pain Score 8   Constitutional: Alert and oriented. Well appearing and in no distress. Eyes: Conjunctivae are normal.  ENT   Head: Normocephalic and atraumatic.   Nose: No congestion/rhinnorhea.   Mouth/Throat: Mucous membranes are moist.   Neck: No stridor. Hematological/Lymphatic/Immunilogical: No cervical  lymphadenopathy. Cardiovascular: Normal rate, regular rhythm.  No murmurs, rubs, or gallops. Respiratory: Normal respiratory effort without tachypnea nor retractions. Breath sounds are clear and equal bilaterally. No wheezes/rales/rhonchi. Gastrointestinal: Soft and non tender. No rebound. No guarding.  Genitourinary: Deferred Musculoskeletal: Normal range of motion in all extremities. No lower extremity edema. Neurologic:  Normal speech and language. No gross focal neurologic deficits are appreciated.  Skin:  Skin is warm, dry and intact. No rash noted. Psychiatric: Mood and affect are normal. Speech and behavior are normal. Patient exhibits appropriate insight and judgment.  ____________________________________________    LABS (pertinent positives/negatives)  Upreg neg ____________________________________________   EKG  None  ____________________________________________    RADIOLOGY  None  ____________________________________________   PROCEDURES  Procedures  ____________________________________________   INITIAL IMPRESSION / ASSESSMENT AND PLAN / ED COURSE  Pertinent labs & imaging results that were available during my care of the patient were reviewed by me and considered in my medical decision making (see chart for details).  Patient presented to the emergency department today because of concerns for bad headache unrelieved by normal naproxen.  On exam patient in no acute distress.  She did feel better after Fioricet.  Will have patient follow-up with primary care doctor.  Discussed with the patient.    ____________________________________________   FINAL CLINICAL IMPRESSION(S) / ED DIAGNOSES  Final diagnoses:  Tension headache     Note: This dictation was prepared with Dragon dictation. Any transcriptional errors that result from this process are unintentional      Phineas SemenGoodman, Abagail Limb, MD 07/08/17 1950

## 2017-07-08 NOTE — ED Notes (Signed)
ED Provider at bedside. 

## 2017-07-08 NOTE — ED Notes (Signed)

## 2017-07-08 NOTE — Discharge Instructions (Signed)
Please seek medical attention for any high fevers, chest pain, shortness of breath, change in behavior, persistent vomiting, bloody stool or any other new or concerning symptoms.  

## 2017-08-12 ENCOUNTER — Emergency Department
Admission: EM | Admit: 2017-08-12 | Discharge: 2017-08-12 | Disposition: A | Discharge status: Home / Self Care | Payer: Medicaid Other | Attending: Emergency Medicine | Admitting: Emergency Medicine

## 2017-08-12 ENCOUNTER — Other Ambulatory Visit: Payer: Self-pay

## 2017-08-12 ENCOUNTER — Emergency Department: Payer: Medicaid Other

## 2017-08-12 DIAGNOSIS — Z79899 Other long term (current) drug therapy: Secondary | ICD-10-CM | POA: Diagnosis not present

## 2017-08-12 DIAGNOSIS — R1010 Upper abdominal pain, unspecified: Secondary | ICD-10-CM

## 2017-08-12 LAB — COMPREHENSIVE METABOLIC PANEL
ALBUMIN: 4.2 g/dL (ref 3.5–5.0)
ALK PHOS: 58 U/L (ref 38–126)
ALT: 19 U/L (ref 14–54)
ANION GAP: 9 (ref 5–15)
AST: 25 U/L (ref 15–41)
BILIRUBIN TOTAL: 0.6 mg/dL (ref 0.3–1.2)
BUN: 15 mg/dL (ref 6–20)
CO2: 21 mmol/L — AB (ref 22–32)
CREATININE: 0.58 mg/dL (ref 0.44–1.00)
Calcium: 8.6 mg/dL — ABNORMAL LOW (ref 8.9–10.3)
Chloride: 107 mmol/L (ref 101–111)
GFR calc Af Amer: >60 mL/min (ref >60)
GFR calc non Af Amer: >60 mL/min (ref >60)
GLUCOSE: 91 mg/dL (ref 65–99)
Potassium: 3.5 mmol/L (ref 3.5–5.1)
Sodium: 137 mmol/L (ref 135–145)
Total Protein: 6.8 g/dL (ref 6.5–8.1)

## 2017-08-12 LAB — URINALYSIS, COMPLETE (UACMP) WITH MICROSCOPIC
GLUCOSE, UA: NEGATIVE mg/dL
Hgb urine dipstick: NEGATIVE
Ketones, ur: NEGATIVE mg/dL
Nitrite: NEGATIVE
PROTEIN: NEGATIVE mg/dL
SPECIFIC GRAVITY, URINE: 1.032 — AB (ref 1.005–1.030)
pH: 5 (ref 5.0–8.0)

## 2017-08-12 LAB — CBC
HCT: 30.5 % — ABNORMAL LOW (ref 35.0–47.0)
HEMOGLOBIN: 9.7 g/dL — AB (ref 12.0–16.0)
MCH: 25.3 pg — AB (ref 26.0–34.0)
MCHC: 31.6 g/dL — AB (ref 32.0–36.0)
MCV: 80.1 fL (ref 80.0–100.0)
Platelets: 250 10*3/uL (ref 150–440)
RBC: 3.81 MIL/uL (ref 3.80–5.20)
RDW: 17.1 % — AB (ref 11.5–14.5)
WBC: 5.6 10*3/uL (ref 3.6–11.0)

## 2017-08-12 LAB — LIPASE, BLOOD: Lipase: 26 U/L (ref 11–51)

## 2017-08-12 LAB — HCG, QUANTITATIVE, PREGNANCY: hCG, Beta Chain, Quant, S: <1 m[IU]/mL (ref <5)

## 2017-08-12 LAB — POCT PREGNANCY, URINE: PREG TEST UR: NEGATIVE

## 2017-08-12 MED ORDER — FAMOTIDINE 20 MG PO TABS
40.0000 mg | ORAL_TABLET | Freq: Once | ORAL | Status: AC
  Administered 2017-08-12: 40 mg via ORAL

## 2017-08-12 MED ORDER — ALUMINUM-MAGNESIUM-SIMETHICONE 200-200-20 MG/5ML PO SUSP: 30.0000 mL | Freq: Three times a day (TID) | ORAL | 0 refills | Status: DC

## 2017-08-12 MED ORDER — GI COCKTAIL ~~LOC~~
30.0000 mL | ORAL | Status: AC
  Administered 2017-08-12: 30 mL via ORAL

## 2017-08-12 MED ORDER — METOCLOPRAMIDE HCL 10 MG PO TABS: 10.0000 mg | ORAL_TABLET | Freq: Four times a day (QID) | ORAL | 0 refills | Status: DC | PRN

## 2017-08-12 MED ORDER — FAMOTIDINE 20 MG PO TABS
20.0000 mg | ORAL_TABLET | Freq: Two times a day (BID) | ORAL | 0 refills | Status: DC
Start: 2017-08-12 — End: 2019-04-26

## 2017-08-12 MED ORDER — IOPAMIDOL (ISOVUE-300) INJECTION 61%
60.0000 mL | Freq: Once | INTRAVENOUS | Status: AC | PRN
  Administered 2017-08-12: 60 mL via INTRAVENOUS
  Filled 2017-08-12: qty 60

## 2017-08-12 NOTE — ED Triage Notes (Signed)
Pt states knot to mid abd pain. States noticed when she was pregnant but then it kind of went away. Took tylenol this AM. States pain was worse this morning. Sent [place]. States nausea but not vomiting. Denies diarrhea.

## 2017-08-12 NOTE — ED Provider Notes (Signed)
[facility] Emergency Department Provider Note  ____________________________________________  Time seen: Approximately 6:12 PM  I have reviewed the triage vital signs and the nursing notes.   HISTORY  Chief Complaint Abdominal Pain    HPI Kaitlin Barton is a 22 y.o. female who complains of mid upper abdominal pain and feeling like there is a "knot" in that area. Pain is nonradiating. No aggravating or alleviating factors. Was severe at primary care clinic so she was sent to the ED for evaluation. Currently about moderate intensity and aching. States that she had similar issues in pregnancy and that it was due to GERD. Denies any medical problems pertinent surgeries.No chest pain shortness of breath fevers or chills. No vomiting or constipation. Symptoms are waxing and waning since onset yesterday.  [facility], sent to the ED for CT imaging due to severity of abdominal pain.     Past Medical History:  Diagnosis Date  . Anxiety   . Chronic tension headaches   . Depression   . TMJ (dislocation of temporomandibular joint)      Patient Active Problem List   Diagnosis Date Noted  . Indication for care in labor and delivery, antepartum 10/16/2015  . Pregnancy affected by fetal growth restriction 10/12/2015  . Preterm contractions 08/30/2015     Past Surgical History:  Procedure Laterality Date  . INNER EAR SURGERY       Prior to Admission medications   Medication Sig Start Date End Date Taking? Authorizing Provider  albuterol (PROVENTIL HFA;VENTOLIN HFA) 108 (90 Base) MCG/ACT inhaler Inhale 2 puffs into the lungs every 6 (six) hours as needed for wheezing or shortness of breath. 05/13/17   Enid Derry, PA-C  aluminum-magnesium hydroxide-simethicone (MAALOX) 200-200-20 MG/5ML SUSP Take 30 mLs by mouth 4 (four) times daily -  before meals and at bedtime. 08/12/17   Sharman Cheek, MD  azithromycin [facility]) 200 MG/5ML  suspension Take 12.59mL (500mg ) on day 1. Take 6.85mL (250mg ) on day 2-5. 05/13/17   Enid Derry, PA-C  benzonatate (TESSALON PERLES) 100 MG capsule Take 1 capsule (100 mg total) by mouth 3 (three) times daily as needed for cough. 05/13/17 05/13/18  Enid Derry, PA-C  butalbital-acetaminophen-caffeine (FIORICET, ESGIC) [phone] MG tablet Take 1 tablet by mouth every 6 (six) hours as needed for headache. 07/08/17 07/08/18  Phineas Semen, MD  famotidine (PEPCID) 20 MG tablet Take 1 tablet (20 mg total) by mouth 2 (two) times daily. 08/12/17   Sharman Cheek, MD  Ferrous Sulfate (IRON) 325 (65 Fe) MG TABS Take 1 tablet (325 mg total) by mouth 3 (three) times daily. 05/13/17   Enid Derry, PA-C  metoCLOPramide (REGLAN) 10 MG tablet Take 1 tablet (10 mg total) by mouth every 6 (six) hours as needed. 08/12/17   Sharman Cheek, MD  naproxen (NAPROSYN) 375 MG tablet Take 1 tablet (375 mg total) by mouth 2 (two) times daily with a meal. 05/10/17 05/10/18  Bridget Hartshorn L, PA-C  ondansetron (ZOFRAN) 4 MG tablet Take 1 tablet (4 mg total) by mouth every 8 (eight) hours as needed for nausea or vomiting. 03/04/15   Jennye Moccasin, MD  predniSONE (DELTASONE) 10 MG tablet Take 6 tablets on day 1, take 5 tablets on day 2, take 4 tablets on day 3, take 3 tablets on day 4, take 2 tablets on day 5, take 1 tablet on day 6 05/13/17   Enid Derry, PA-C     Allergies Sulfa antibiotics   History  reviewed. No pertinent family history.  Social History Social History   Tobacco Use  . Smoking status: Never Smoker  . Smokeless tobacco: Never Used  Substance Use Topics  . Alcohol use: No    Alcohol/week: 0.0 oz  . Drug use: No    Review of Systems  Constitutional:   No fever or chills.  ENT:   No sore throat. No rhinorrhea. Cardiovascular:   No chest pain or syncope. Respiratory:   No dyspnea or cough. Gastrointestinal:   Positive as above abdominal pain without vomiting and diarrhea.  Negative for irregular vaginal bleeding or discharge Musculoskeletal:   Negative for focal pain or swelling All other systems reviewed and are negative except as documented above in ROS and HPI.  ____________________________________________   PHYSICAL EXAM:  VITAL SIGNS: ED Triage Vitals  Enc Vitals Group     BP 08/12/17 1312 116/76     Pulse Rate 08/12/17 1312 91     Resp 08/12/17 1312 18     Temp 08/12/17 1312 98 F (36.7 C)     Temp Source 08/12/17 1312 Oral     SpO2 08/12/17 1312 100 %     Weight 08/12/17 1313 88 lb (39.9 kg)     Height 08/12/17 1313 5\' 3"  (1.6 m)     Head Circumference --      Peak Flow --      Pain Score 08/12/17 1312 10     Pain Loc --      Pain Edu? --      Excl. in GC? --     Vital signs reviewed, nursing assessments reviewed.   Constitutional:   Alert and oriented. Well appearing and in no distress. Eyes:   No scleral icterus.  EOMI. No nystagmus. No conjunctival pallor. PERRL. ENT   Head:   Normocephalic and atraumatic.   Nose:   No congestion/rhinnorhea.    Mouth/Throat:   MMM, no pharyngeal erythema. No peritonsillar mass.    Neck:   No meningismus. Full ROM. Hematological/Lymphatic/Immunilogical:   No cervical lymphadenopathy. Cardiovascular:   RRR. Symmetric bilateral radial and DP pulses.  No murmurs.  Respiratory:   Normal respiratory effort without tachypnea/retractions. Breath sounds are clear and equal bilaterally. No wheezes/rales/rhonchi. Gastrointestinal:   Soft with mild generalized tenderness. Non distended. There is no CVA tenderness.  No rebound, rigidity, or guarding. Genitourinary:   deferred Musculoskeletal:   Normal range of motion in all extremities. No joint effusions.  No lower extremity tenderness.  No edema. Neurologic:   Normal speech and language.  Motor grossly intact. No acute focal neurologic deficits are appreciated.  Skin:    Skin is warm, dry and intact. No rash noted.  No petechiae, purpura, or  bullae.  ____________________________________________    LABS (pertinent positives/negatives) (all labs ordered are listed, but only abnormal results are displayed) Labs Reviewed  COMPREHENSIVE METABOLIC PANEL - Abnormal; Notable for the following components:      Result Value   CO2 21 (*)    Calcium 8.6 (*)    All other components within normal limits  CBC - Abnormal; Notable for the following components:   Hemoglobin 9.7 (*)    HCT 30.5 (*)    MCH 25.3 (*)    MCHC 31.6 (*)    RDW 17.1 (*)    All other components within normal limits  URINALYSIS, COMPLETE (UACMP) WITH MICROSCOPIC - Abnormal; Notable for the following components:   Color, Urine YELLOW (*)    APPearance HAZY (*)  Specific Gravity, Urine 1.032 (*)    Bilirubin Urine SMALL (*)    Leukocytes, UA TRACE (*)    Bacteria, UA RARE (*)    Squamous Epithelial / LPF 6-30 (*)    All other components within normal limits  LIPASE, BLOOD  HCG, QUANTITATIVE, PREGNANCY  POCT PREGNANCY, URINE  POC URINE PREG, ED   ____________________________________________   EKG    ____________________________________________    RADIOLOGY  Ct Abdomen Pelvis W Contrast  Result Date: 08/12/2017 CLINICAL DATA:  Abdominal pain and nausea EXAM: CT ABDOMEN AND PELVIS WITH CONTRAST TECHNIQUE: Multidetector CT imaging of the abdomen and pelvis was performed using the standard protocol following bolus administration of intravenous contrast. CONTRAST:  60mL ISOVUE-300 IOPAMIDOL (ISOVUE-300) INJECTION 61% COMPARISON:  March 17, 2016 FINDINGS: Lower chest: Lung bases are clear. Hepatobiliary: No focal liver lesions are appreciable. Gallbladder wall is not appreciably thickened. There is no biliary duct dilatation. Pancreas: No pancreatic mass or inflammatory focus. Spleen: No splenic lesions are evident. Adrenals/Urinary Tract: Adrenals bilaterally appear normal. Kidneys bilaterally show no evident mass or hydronephrosis. No renal or ureteral  calculus. Urinary bladder is midline with questionable mild wall thickening given nearly empty bladder. Stomach/Bowel: There is no appreciable bowel wall or mesenteric thickening. There is no appreciable bowel obstruction. No free air or portal venous air. Vascular/Lymphatic: No abdominal aortic aneurysm. No vascular lesions are evident. There is no appreciable adenopathy in the abdomen or pelvis. Reproductive: The uterus is anteverted. There is no evident pelvic mass. Other: The appendix region appears normal. No abscess or ascites is evident in the abdomen or pelvis. Musculoskeletal: There is lumbar dextroscoliosis. There are no blastic or lytic bone lesions. No intramuscular or abdominal wall lesions are evident. IMPRESSION: 1. Questionable mild urinary bladder wall thickening. There may be a degree of cystitis. Correlation with urinalysis advised. 2. No appreciable bowel wall thickening or bowel obstruction. Appendix region appears normal. No abscess. 3.  No renal or ureteral calculus.  No hydronephrosis. Electronically Signed   By: Bretta BangWilliam  Woodruff III M.D.   On: 08/12/2017 17:09    ____________________________________________   PROCEDURES Procedures  ____________________________________________    CLINICAL IMPRESSION / ASSESSMENT AND PLAN / ED COURSE  Pertinent labs & imaging results that were available during my care of the patient were reviewed by me and considered in my medical decision making (see chart for details).   Patient well appearing no acute distress, normal vital signs. Does have abdominal tenderness but is nonfocal. Labs are reassuring. Due to severity of symptoms in the primary care clinic, will proceed with the CT scan. If negative, I think patient is suitable for outpatient follow-up with trial of therapy's for GERD.  ----------------------------------------- 6:16 PM on 08/12/2017 -----------------------------------------  CT negative. No worsening of symptoms. Plan  to discharge with Maalox Pepcid and Reglan.Considering the patient's symptoms, medical history, and physical examination today, I have low suspicion for cholecystitis or biliary pathology, pancreatitis, perforation or bowel obstruction, hernia, intra-abdominal abscess, AAA or dissection, volvulus or intussusception, mesenteric ischemia, or appendicitis.        ____________________________________________   FINAL CLINICAL IMPRESSION(S) / ED DIAGNOSES    Final diagnoses:  Upper abdominal pain       Portions of this note were generated with dragon dictation software. Dictation errors may occur despite best attempts at proofreading.    Sharman CheekStafford, Drucilla Cumber, MD 08/12/17 1816

## 2017-08-12 NOTE — Discharge Instructions (Signed)
Your labs and CT scan of the abdomen today were unremarkable. Take medications to control stomach acid and follow-up with your primary care doctor.

## 2018-03-13 ENCOUNTER — Other Ambulatory Visit: Payer: Self-pay

## 2018-03-13 ENCOUNTER — Emergency Department
Admission: EM | Admit: 2018-03-13 | Discharge: 2018-03-14 | Disposition: A | Discharge status: Home / Self Care | Payer: Self-pay | Attending: Emergency Medicine | Admitting: Emergency Medicine

## 2018-03-13 ENCOUNTER — Encounter: Payer: Self-pay | Admitting: Emergency Medicine

## 2018-03-13 ENCOUNTER — Emergency Department: Payer: Self-pay

## 2018-03-13 DIAGNOSIS — Z79899 Other long term (current) drug therapy: Secondary | ICD-10-CM | POA: Insufficient documentation

## 2018-03-13 DIAGNOSIS — R0789 Other chest pain: Secondary | ICD-10-CM | POA: Insufficient documentation

## 2018-03-13 DIAGNOSIS — R0609 Other forms of dyspnea: Secondary | ICD-10-CM | POA: Insufficient documentation

## 2018-03-13 LAB — BASIC METABOLIC PANEL
Anion gap: 5 (ref 5–15)
BUN: 7 mg/dL (ref 6–20)
CALCIUM: 9.3 mg/dL (ref 8.9–10.3)
CO2: 26 mmol/L (ref 22–32)
CREATININE: 0.74 mg/dL (ref 0.44–1.00)
Chloride: 107 mmol/L (ref 98–111)
GFR calc non Af Amer: >60 mL/min (ref >60)
Glucose, Bld: 99 mg/dL (ref 70–99)
Potassium: 3.9 mmol/L (ref 3.5–5.1)
SODIUM: 138 mmol/L (ref 135–145)

## 2018-03-13 LAB — URINALYSIS, COMPLETE (UACMP) WITH MICROSCOPIC
Bilirubin Urine: NEGATIVE
Glucose, UA: NEGATIVE mg/dL
Hgb urine dipstick: NEGATIVE
KETONES UR: NEGATIVE mg/dL
LEUKOCYTES UA: NEGATIVE
NITRITE: NEGATIVE
PH: 7 (ref 5.0–8.0)
Protein, ur: NEGATIVE mg/dL
SPECIFIC GRAVITY, URINE: 1.005 (ref 1.005–1.030)

## 2018-03-13 LAB — CBC
HCT: 35.8 % (ref 35.0–47.0)
Hemoglobin: 12.2 g/dL (ref 12.0–16.0)
MCH: 28.8 pg (ref 26.0–34.0)
MCHC: 34.1 g/dL (ref 32.0–36.0)
MCV: 84.6 fL (ref 80.0–100.0)
PLATELETS: 212 10*3/uL (ref 150–440)
RBC: 4.23 MIL/uL (ref 3.80–5.20)
RDW: 15.2 % — ABNORMAL HIGH (ref 11.5–14.5)
WBC: 5.8 10*3/uL (ref 3.6–11.0)

## 2018-03-13 LAB — TROPONIN I: Troponin I: <0.03 ng/mL (ref <0.03)

## 2018-03-13 LAB — POCT PREGNANCY, URINE: Preg Test, Ur: NEGATIVE

## 2018-03-13 NOTE — ED Notes (Signed)
Pt reports that she thinks that "my vaping is killing me".

## 2018-03-13 NOTE — ED Triage Notes (Signed)
Pt arrives ambulatory to triage with c/o chest pain. Pt reports mid sternum chest pain. Pt is in NAD.

## 2018-03-14 LAB — FIBRIN DERIVATIVES D-DIMER (ARMC ONLY): Fibrin derivatives D-dimer (ARMC): 150.3 ng/mL (FEU) (ref 0.00–499.00)

## 2018-03-14 NOTE — ED Provider Notes (Signed)
[facility] Emergency Department Provider Note  ____________________________________________   First MD Initiated Contact with Patient 03/14/18 0021     (approximate)  I have reviewed the triage vital signs and the nursing notes.   HISTORY  Chief Complaint Chest Pain    HPI Kaitlin Barton is a 22 y.o. female with medical history as listed below who also vapes but does not smoke cigarettes and who is on birth control.  She presents for evaluation of 2 weeks of intermittent central substernal chest pain associated with dyspnea on exertion.  She reports that she feels like she cannot do much of anything without becoming shortness of breath which is different for her.  Is been gradual in onset as severe but also episodic.  Currently she feels okay but she felt a lot of sharp and aching pain earlier tonight.  Nothing in particular makes it better but it does seem to go away or at least get better on its own.  She denies fever/chills, nausea, vomiting, abdominal pain, and dysuria.  She is quite scared because she vapes and is concerned that she has lung injury associated with the vaping.  She has a history of anxiety attacks but says that this feels different.   She has not been on any long trips recently and has had no immobilizations.  She uses birth control.  No history of blood clots in her legs or her lungs.  Past Medical History:  Diagnosis Date  . Anxiety   . Chronic tension headaches   . Depression   . TMJ (dislocation of temporomandibular joint)     Patient Active Problem List   Diagnosis Date Noted  . Indication for care in labor and delivery, antepartum 10/16/2015  . Pregnancy affected by fetal growth restriction 10/12/2015  . Preterm contractions 08/30/2015    Past Surgical History:  Procedure Laterality Date  . INNER EAR SURGERY      Prior to Admission medications   Medication Sig Start Date End Date Taking? Authorizing Provider  albuterol  (PROVENTIL HFA;VENTOLIN HFA) 108 (90 Base) MCG/ACT inhaler Inhale 2 puffs into the lungs every 6 (six) hours as needed for wheezing or shortness of breath. 05/13/17   Enid Derry, PA-C  aluminum-magnesium hydroxide-simethicone (MAALOX) 200-200-20 MG/5ML SUSP Take 30 mLs by mouth 4 (four) times daily -  before meals and at bedtime. 08/12/17   Sharman Cheek, MD  azithromycin [facility]) 200 MG/5ML suspension Take 12.69mL (500mg ) on day 1. Take 6.70mL (250mg ) on day 2-5. 05/13/17   Enid Derry, PA-C  benzonatate (TESSALON PERLES) 100 MG capsule Take 1 capsule (100 mg total) by mouth 3 (three) times daily as needed for cough. 05/13/17 05/13/18  Enid Derry, PA-C  butalbital-acetaminophen-caffeine (FIORICET, ESGIC) [phone] MG tablet Take 1 tablet by mouth every 6 (six) hours as needed for headache. 07/08/17 07/08/18  Phineas Semen, MD  famotidine (PEPCID) 20 MG tablet Take 1 tablet (20 mg total) by mouth 2 (two) times daily. 08/12/17   Sharman Cheek, MD  Ferrous Sulfate (IRON) 325 (65 Fe) MG TABS Take 1 tablet (325 mg total) by mouth 3 (three) times daily. 05/13/17   Enid Derry, PA-C  metoCLOPramide (REGLAN) 10 MG tablet Take 1 tablet (10 mg total) by mouth every 6 (six) hours as needed. 08/12/17   Sharman Cheek, MD  naproxen (NAPROSYN) 375 MG tablet Take 1 tablet (375 mg total) by mouth 2 (two) times daily with a meal. 05/10/17 05/10/18  Bridget Hartshorn L, PA-C  ondansetron [facility]) 4  MG tablet Take 1 tablet (4 mg total) by mouth every 8 (eight) hours as needed for nausea or vomiting. 03/04/15   Jennye Moccasin, MD  predniSONE (DELTASONE) 10 MG tablet Take 6 tablets on day 1, take 5 tablets on day 2, take 4 tablets on day 3, take 3 tablets on day 4, take 2 tablets on day 5, take 1 tablet on day 6 05/13/17   Enid Derry, PA-C    Allergies Sulfa antibiotics  No family history on file.  Social History Social History   Tobacco Use  . Smoking status: Never Smoker  . Smokeless  tobacco: Never Used  Substance Use Topics  . Alcohol use: No    Alcohol/week: 0.0 standard drinks  . Drug use: No    Frequency: 4.0 times per week    Review of Systems Constitutional: No fever/chills Eyes: No visual changes. ENT: No sore throat. Cardiovascular: Chest pain as described above Respiratory: Dyspnea on exertion as described above Gastrointestinal: No abdominal pain.  No nausea, no vomiting.  No diarrhea.  No constipation. Genitourinary: Negative for dysuria. Musculoskeletal: Negative for neck pain.  Negative for back pain. Integumentary: Negative for rash. Neurological: Negative for headaches, focal weakness or numbness.   ____________________________________________   PHYSICAL EXAM:  ED Triage Vitals  Enc Vitals Group     BP 03/13/18 2230 (!) 104/59     Pulse Rate 03/13/18 2230 (!) 56     Resp 03/13/18 2230 18     Temp 03/13/18 2203 98.8 F (37.1 C)     Temp Source 03/13/18 2203 Oral     SpO2 03/13/18 2230 100 %     Weight 03/13/18 2203 43.1 kg (95 lb)     Height 03/13/18 2203 1.6 m (5\' 3" )     Head Circumference --      Peak Flow --      Pain Score 03/13/18 2203 10     Pain Loc --      Pain Edu? --      Excl. in GC? --      Constitutional: Alert and oriented. Well appearing and in no acute distress. Eyes: Conjunctivae are normal.  Head: Atraumatic. Nose: No congestion/rhinnorhea. Mouth/Throat: Mucous membranes are moist. Neck: No stridor.  No meningeal signs.   Cardiovascular: Normal rate, regular rhythm. Good peripheral circulation. Grossly normal heart sounds. Respiratory: Normal respiratory effort.  No retractions. Lungs CTAB. Gastrointestinal: Soft and nontender. No distention.  Musculoskeletal: No lower extremity tenderness nor edema. No gross deformities of extremities. Neurologic:  Normal speech and language. No gross focal neurologic deficits are appreciated.  Skin:  Skin is warm, dry and intact. No rash noted. Psychiatric: Mood and  affect are anxious but generally appropriate.  ____________________________________________   LABS (all labs ordered are listed, but only abnormal results are displayed)  Labs Reviewed  CBC - Abnormal; Notable for the following components:      Result Value   RDW 15.2 (*)    All other components within normal limits  URINALYSIS, COMPLETE (UACMP) WITH MICROSCOPIC - Abnormal; Notable for the following components:   Color, Urine COLORLESS (*)    APPearance CLEAR (*)    Bacteria, UA RARE (*)    All other components within normal limits  BASIC METABOLIC PANEL  TROPONIN I  FIBRIN DERIVATIVES D-DIMER ([facility] ONLY)  POC URINE PREG, ED  POCT PREGNANCY, URINE   ____________________________________________  EKG  ED ECG REPORT I, Loleta Rose, the attending physician, personally viewed and interpreted this ECG.  Date: 03/13/2018 EKG Time: 22: 08 Rate: 91 Rhythm: normal sinus rhythm QRS Axis: Right axis deviation Intervals: normal ST/T Wave abnormalities: Non-specific ST segment / T-wave changes, but no evidence of acute ischemia. Narrative Interpretation: no evidence of acute ischemia   ____________________________________________  RADIOLOGY I, Loleta Rose, personally viewed and evaluated these images (plain radiographs) as part of my medical decision making, as well as reviewing the written report by the radiologist.  ED MD interpretation: No acute abnormalities including no evidence of bilateral infiltrates that should be seen with Vaping Associated Pulmonary Injury (VAPI).  Official radiology report(s): Dg Chest 2 View  Result Date: 03/13/2018 CLINICAL DATA:  Chest pain EXAM: CHEST - 2 VIEW COMPARISON:  05/13/2017 FINDINGS: The heart size and mediastinal contours are within normal limits. Both lungs are clear. The visualized skeletal structures are unremarkable. IMPRESSION: No active cardiopulmonary disease. Electronically Signed   By: Jasmine Pang M.D.   On: 03/13/2018 22:47     ____________________________________________   PROCEDURES  Critical Care performed: No   Procedure(s) performed:   Procedures   ____________________________________________   INITIAL IMPRESSION / ASSESSMENT AND PLAN / ED COURSE  As part of my medical decision making, I reviewed the following data within the electronic MEDICAL RECORD NUMBER Nursing notes reviewed and incorporated, Labs reviewed , EKG interpreted , Radiograph reviewed  and Notes from prior ED visits    Differential diagnosis includes, but is not limited to, anxiety/panic attacks, CAP, VAPI, pneumothorax, pulmonary embolism.  The patient is well-appearing in no acute distress but obviously anxious, I suspect because she thinks that she is sustained a lung injury from the vaping.  However her laboratory work-up is reassuring with no evidence of the leukocytosis that would be anticipated with VAPI or community-acquired pneumonia.  Her chest x-ray is clear which is also reassuring and shows no signs of the bilateral infiltrates anticipated with VAPI.  Since she is on birth control and reporting both episodic chest pain and dyspnea on exertion, I will check a d-dimer; she has no tachycardia, no hypoxemia, and is low risk based on Wells Score, but I am not comfortable completely ruling her out with [facility] criteria given the use of birth control and the description of her symptoms and the lack of a better explanation (other than panic attacks).  I had my usual and customary d-dimer discussion with the patient and she understands and agrees with the plan and with the plan for outpatient follow-up should the d-dimer be normal.  There is no indication for repeat troponin given the history of present illness and her low risk based on HEART score.  Clinical Course as of Mar 14 602  Wynelle Link Mar 14, 2018  0151 D-dimer normal.  Will discuss with patient and discharge for outpatient follow up  Fibrin derivatives D-dimer [facility]): 150.30 [CF]      Clinical Course User Index [CF] Loleta Rose, MD    ____________________________________________  FINAL CLINICAL IMPRESSION(S) / ED DIAGNOSES  Final diagnoses:  Atypical chest pain  Dyspnea on exertion     MEDICATIONS GIVEN DURING THIS VISIT:  Medications - No data to display   ED Discharge Orders    None       Note:  This document was prepared using Dragon voice recognition software and may include unintentional dictation errors.    Loleta Rose, MD 03/14/18 [phone]

## 2018-03-14 NOTE — Discharge Instructions (Addendum)
Your workup in the Emergency Department today was reassuring.  We did not find any specific abnormalities.  We recommend you drink plenty of fluids, take your regular medications and/or any new ones prescribed today, and follow up with the doctor(s) listed in these documents as recommended.  Although there is no evidence that you have any lung injury due to vaping at this time, we strongly encourage you to stop vaping and using any tobacco products!  Return to the Emergency Department if you develop new or worsening symptoms that concern you.

## 2018-03-14 NOTE — ED Notes (Signed)
Patient to ED for chest pain and shortness of breath x 2 weeks. States she can "literally walk to the bathroom and get short of breath." States she uses a vape and was told by her coworkers "to make sure she tells [place] that." She denies using anything illegal in her vape and denies the use of synthetic marijuana in the vape as well. Patient states the pain is intermittent and is currently not present. Denies injury to the chest wall or having a respiratory illness in the past two weeks. No audible wheezing is noted.

## 2019-04-01 ENCOUNTER — Other Ambulatory Visit: Payer: Self-pay

## 2019-04-01 DIAGNOSIS — Z20822 Contact with and (suspected) exposure to covid-19: Secondary | ICD-10-CM

## 2019-04-03 LAB — NOVEL CORONAVIRUS, NAA: SARS-CoV-2, NAA: NOT DETECTED

## 2019-04-26 ENCOUNTER — Other Ambulatory Visit: Payer: Self-pay

## 2019-04-26 ENCOUNTER — Encounter: Payer: Self-pay | Admitting: Emergency Medicine

## 2019-04-26 ENCOUNTER — Ambulatory Visit
Admission: EM | Admit: 2019-04-26 | Discharge: 2019-04-26 | Disposition: A | Discharge status: Home / Self Care | Payer: Medicaid Other

## 2019-04-26 DIAGNOSIS — H6691 Otitis media, unspecified, right ear: Secondary | ICD-10-CM

## 2019-04-26 MED ORDER — NEOMYCIN-POLYMYXIN-HC 3.5-10000-1 OT SUSP: 4.0000 [drp] | Freq: Three times a day (TID) | OTIC | 0 refills | Status: DC

## 2019-04-26 MED ORDER — AMOXICILLIN-POT CLAVULANATE 600-42.9 MG/5ML PO SUSR: 875.0000 mg | Freq: Two times a day (BID) | ORAL | 0 refills | Status: AC

## 2019-04-26 NOTE — ED Provider Notes (Signed)
[place], [place]   Name: Kaitlin Barton DOB: 21-Mar-1996 MRN: [id number] CSN: [id number] PCP: Emogene Morgan, MD  Arrival date and time:  04/26/19 1723  Chief Complaint:  Ear Fullness   NOTE: Prior to seeing the patient today, I have reviewed the triage nursing documentation and vital signs. Clinical staff has updated patient's PMH/PSHx, current medication list, and drug allergies/intolerances to ensure comprehensive history available to assist in medical decision making.   History:   HPI: Kaitlin Barton is a 23 y.o. female who presents today with complaints of pain in her RIGHT ear. Pain began with acute onset approximately 3 days ago. She denies any associated fevers. Patient has not had cough, congestion, rhinorrhea, or sneezing, She reports that her throat feels "swollen and scratchy". She denies forceful nose blowing. Patient reports that she was cleaning her ears out with a cotton tipped swab and appreciated bright green drainage that had a foul odor. She advises that her ability to hear from the RIGHT ear has acutely changed with the onset of the pain; describes hearing as being muffled. Patient has a history of recurrent ear infections. She has had tympanostomy tubes in the past. Patient is very anxious in clinic today.   Past Medical History:  Diagnosis Date  . Anxiety   . Chronic tension headaches   . Depression   . TMJ (dislocation of temporomandibular joint)     Past Surgical History:  Procedure Laterality Date  . INNER EAR SURGERY      History reviewed. No pertinent family history.  Social History   Tobacco Use  . Smoking status: Never Smoker  . Smokeless tobacco: Never Used  Substance Use Topics  . Alcohol use: No    Alcohol/week: 0.0 standard drinks  . Drug use: No    Frequency: 4.0 times per week    Patient Active Problem List   Diagnosis Date Noted  . Indication for care in labor and delivery, antepartum 10/16/2015  . Pregnancy affected by fetal growth  restriction 10/12/2015  . Preterm contractions 08/30/2015    Home Medications:    Current Meds  Medication Sig  . sertraline (ZOLOFT) 25 MG tablet TAKE 1 TABLET BY MOUTH DAILY FOR MOOD    Allergies:   Sulfa antibiotics  Review of Systems (ROS): Review of Systems  Constitutional: Negative for fatigue and fever.  HENT: Positive for ear discharge, ear pain and sore throat. Negative for congestion, postnasal drip, rhinorrhea, sinus pressure, sinus pain and sneezing.   Eyes: Negative for pain, discharge and redness.  Respiratory: Negative for cough, chest tightness and shortness of breath.   Cardiovascular: Negative for chest pain and palpitations.  Gastrointestinal: Negative for abdominal pain, diarrhea, nausea and vomiting.  Musculoskeletal: Negative for arthralgias, back pain, myalgias and neck pain.  Skin: Negative for color change, pallor and rash.  Neurological: Negative for dizziness, syncope, weakness and headaches.  Hematological: Negative for adenopathy.  Psychiatric/Behavioral: The patient is nervous/anxious.      Vital Signs: Today's Vitals   04/26/19 1758 04/26/19 1759 04/26/19 1802 04/26/19 1835  BP:   113/82   Pulse:   (!) 105   Resp:   18   Temp:   98.3 F (36.8 C)   TempSrc:   Oral   SpO2:   100%   Weight:  89 lb (40.4 kg)    Height:  5\' 3"  (1.6 m)    PainSc: 8    8     Physical Exam: Physical Exam  Constitutional: She  is oriented to person, place, and time and well-developed, well-nourished, and in no distress. No distress.  HENT:  Head: Normocephalic and atraumatic.  Right Ear: There is drainage (bright green), swelling and tenderness. Tympanic membrane is erythematous and bulging. A middle ear effusion (suppurative) is present. Decreased hearing (muffled) is noted.  Left Ear: Tympanic membrane normal.  Nose: Nose normal.  Mouth/Throat: Uvula is midline and mucous membranes are normal. Posterior oropharyngeal erythema present. No oropharyngeal exudate  or posterior oropharyngeal edema.  Eyes: Pupils are equal, round, and reactive to light. Conjunctivae and EOM are normal.  Neck: Normal range of motion. Neck supple.  Cardiovascular: Regular rhythm, normal heart sounds and intact distal pulses. Tachycardia present. Exam reveals no gallop and no friction rub.  No murmur heard. Pulmonary/Chest: Effort normal. No respiratory distress. She has no decreased breath sounds. She has no wheezes. She has no rhonchi. She has no rales.  Abdominal: Soft. Normal appearance and bowel sounds are normal. She exhibits no distension. There is no abdominal tenderness.  Musculoskeletal: Normal range of motion.  Lymphadenopathy:       Head (right side): Submandibular and posterior auricular adenopathy present.    She has cervical adenopathy.       Right cervical: Superficial cervical adenopathy present.  Neurological: She is alert and oriented to person, place, and time. Gait normal.  Skin: Skin is warm and dry. No rash noted. She is not diaphoretic.  Psychiatric: Memory, affect and judgment normal. Her mood appears anxious.  Nursing note and vitals reviewed.   Urgent Care Treatments / Results:   LABS: PLEASE NOTE: all labs that were ordered this encounter are listed, however only abnormal results are displayed. Labs Reviewed - No data to display  EKG: -None  RADIOLOGY: No results found.  PROCEDURES: Procedures  MEDICATIONS RECEIVED THIS VISIT: Medications - No data to display  PERTINENT CLINICAL COURSE NOTES/UPDATES:   Initial Impression / Assessment and Plan / Urgent Care Course:  Pertinent labs & imaging results that were available during my care of the patient were personally reviewed by me and considered in my medical decision making (see lab/imaging section of note for values and interpretations).  Vilinda BoehringerLacey D Shonk is a 23 y.o. female who [facility] with complaints of Ear Fullness   Patient is well appearing overall  in clinic today. She does not appear to be in any acute distress. Presenting symptoms (see HPI) and exam as documented above. No fevers. RIGHT ear with suppurative effusion in addition to an EAC that is filled with bright green drainage. She has tender palpable LAD on the RIGHT. Patient denies SOB and dysphagia. Given appearance of the ear, the decision was made to pursue treatment for both an internal and external ear infection. Will cover with a 10 day course of amoxicillin-clavulanate, in addition to a 5 day course of topical Cortisporin otic gtts. Patient encouraged to keep ears clean and dry. Educated on need to avoid the use of cotton tipped swabs. Discussed supportive care measures at home during acute phase of illness. Patient to rest as much as possible. She was encouraged to ensure adequate hydration (water and ORS) to prevent dehydration and electrolyte derangements. Patient may use APAP and/or IBU on an as needed basis for pain/fever.   Discussed follow up with primary care physician in 1 week for re-evaluation. I have reviewed the follow up and strict return precautions for any new or worsening symptoms. Patient is aware of symptoms that would be deemed  urgent/emergent, and would thus require further evaluation either here or in the emergency department. At the time of discharge, she verbalized understanding and consent with the discharge plan as it was reviewed with her. All questions were fielded by provider and/or clinic staff prior to patient discharge.    Final Clinical Impressions / Urgent Care Diagnoses:   Final diagnoses:  Acute infection of right ear    New Prescriptions:  [place] Controlled Substance Registry consulted? Not Applicable  Meds ordered this encounter  Medications  . amoxicillin-clavulanate (AUGMENTIN ES-600) 600-42.9 MG/5ML suspension    Sig: Take 7.3 mLs (875 mg total) by mouth 2 (two) times daily for 10 days.    Dispense:  150 mL    Refill:  0  .  neomycin-polymyxin-hydrocortisone (CORTISPORIN) 3.5-10000-1 OTIC suspension    Sig: Place 4 drops into the right ear 3 (three) times daily. X 5 days    Dispense:  10 mL    Refill:  0    Recommended Follow up Care:  Patient encouraged to follow up with the following provider within the specified time frame, or sooner as dictated by the severity of her symptoms. As always, she was instructed that for any urgent/emergent care needs, she should seek care either here or in the emergency department for more immediate evaluation.  Follow-up Information    Aycock, Ngwe A, MD In 1 week.   Specialty: Family Medicine Why: General reassessment of symptoms if not improving Contact information: [place] [address] [phone]         NOTE: This note was prepared using Dragon dictation software along with smaller phrase technology. Despite my best ability to proofread, there is the potential that transcriptional errors may still occur from this process, and are completely unintentional.    Karen Kitchens, NP 04/27/19 2243

## 2019-04-26 NOTE — Discharge Instructions (Signed)
It was very nice seeing you today in clinic. Thank you for entrusting me with your care.   Rest and increase fluid intake. Use antibiotics (oral and topical) as prescribed. May use Tylenol and/or Ibuprofen as needed for pain/fever.   Make arrangements to follow up with your regular doctor in 1 week for re-evaluation if not improving. If your symptoms/condition worsens, please seek follow up care either here or in the ER. Please remember, our [place] providers are "right here with you" when you need [place].   Again, it was my pleasure to take care of you today. Thank you for choosing our clinic. I hope that you start to feel better quickly.   Honor Loh, MSN, APRN, FNP-C, CEN Advanced Practice Provider [facility]

## 2019-04-26 NOTE — ED Triage Notes (Addendum)
Patient c/o ear fullness that started 3 days ago. She states her throat feels like it is swollen. She denies sore throat, denies fever.  Patient reports several times during triage how anxious she is.

## 2020-02-08 IMAGING — CT CT ABD-PELV W/ CM
2 of 4 series · 16 of 46 positions shown, 18 images · IV contrast (APPLIED)
Comparison: March 17, 2016

CLINICAL DATA: Abdominal pain and nausea

EXAM:
CT ABDOMEN AND PELVIS WITH CONTRAST
TECHNIQUE: Multidetector CT imaging of the abdomen and pelvis was performed
using the standard protocol following bolus administration of
intravenous contrast.
CONTRAST:  60mL [id number] IOPAMIDOL ([id number]) INJECTION 61%

[Series 2: routine abd/pel with · axial · 0.62mm/px · z∈[-772,-397]mm · 13 of 83 slices shown, 15 images]
[im 4/83  soft-tissue]
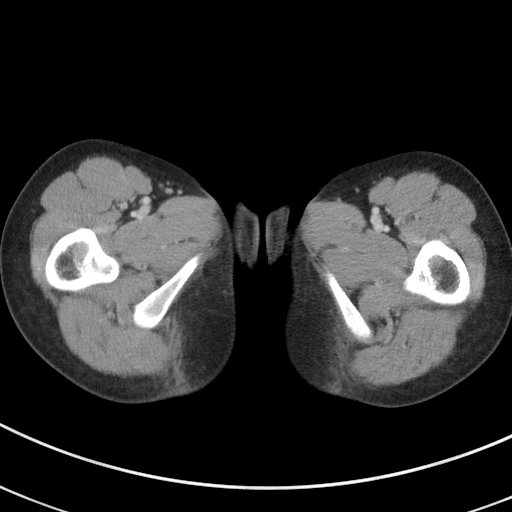
[im 4/83  bone]
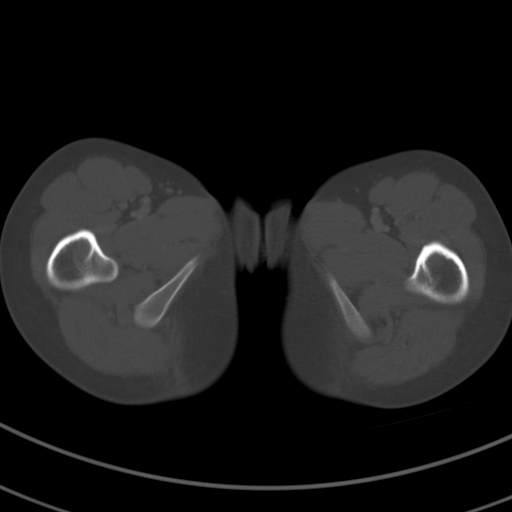
[im 10/83  soft-tissue]
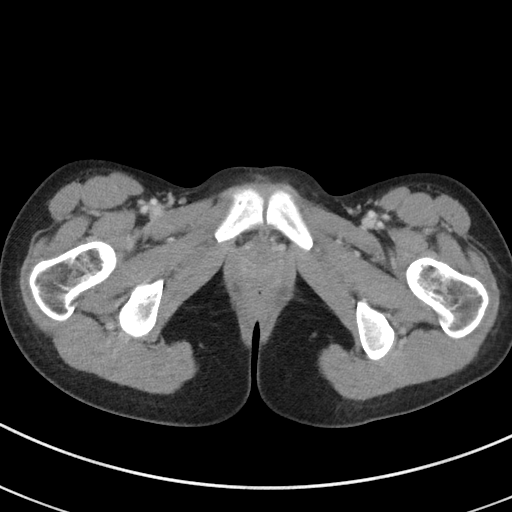
[im 16/83  soft-tissue]
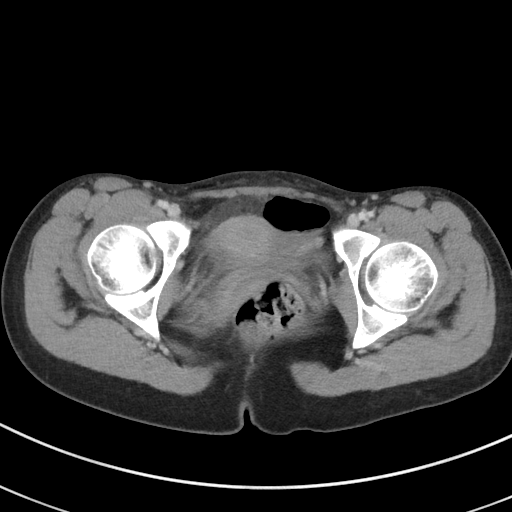
[im 23/83  soft-tissue]
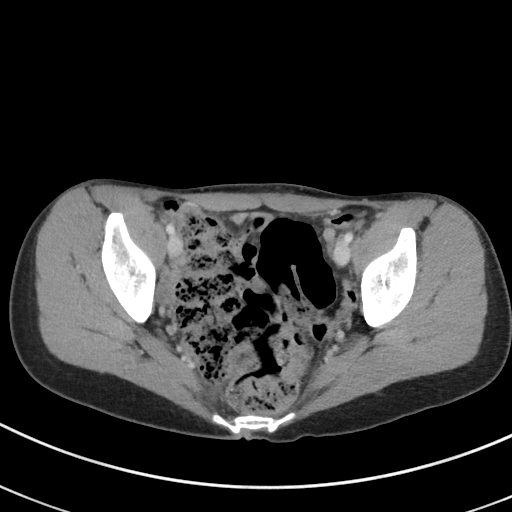
[im 29/83  soft-tissue]
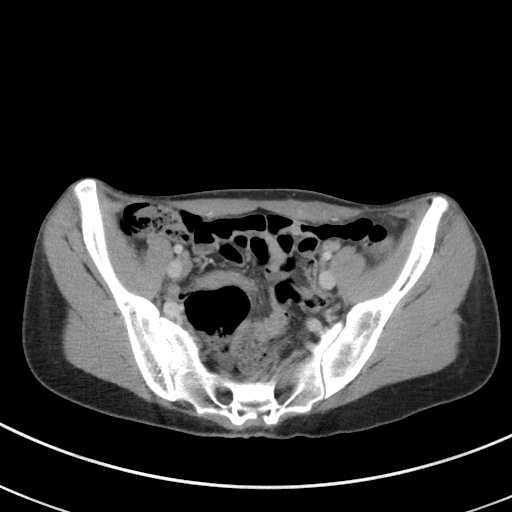
[im 35/83  soft-tissue]
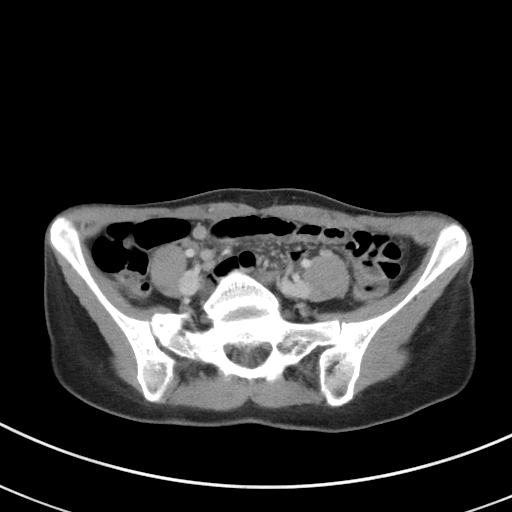
[im 42/83  soft-tissue]
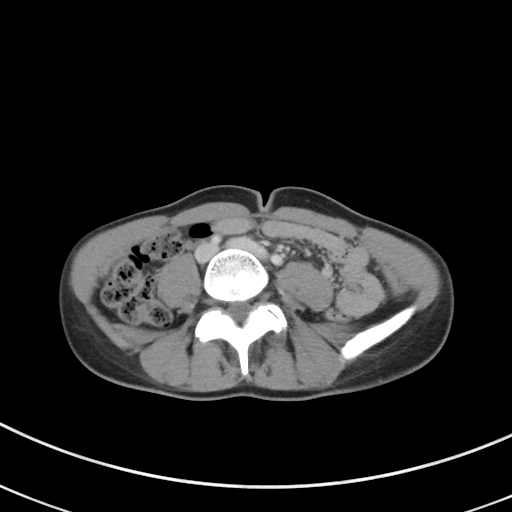
[im 48/83  soft-tissue]
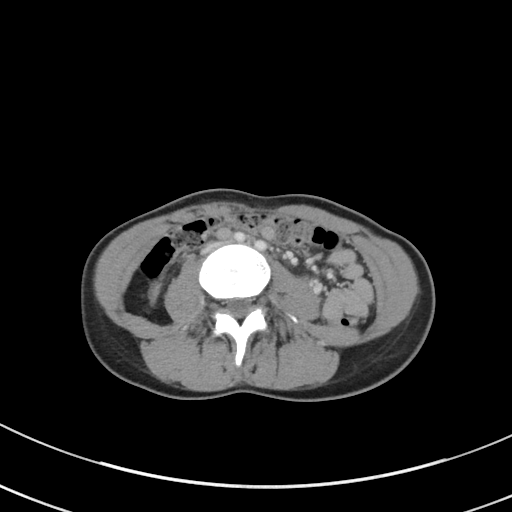
[im 54/83  soft-tissue]
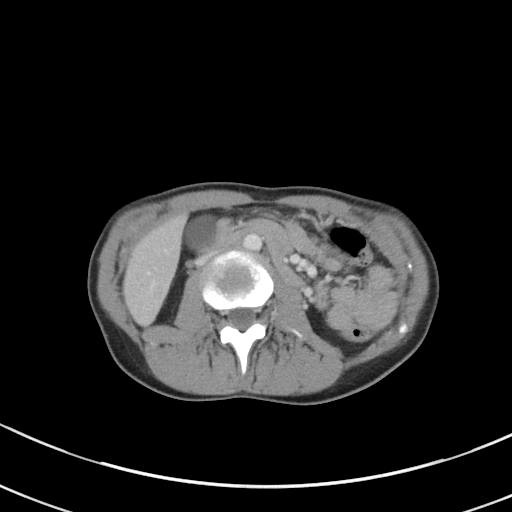
[im 54/83  bone]
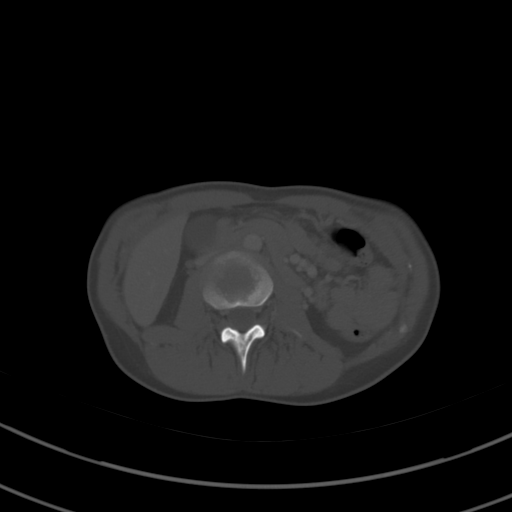
[im 60/83  soft-tissue]
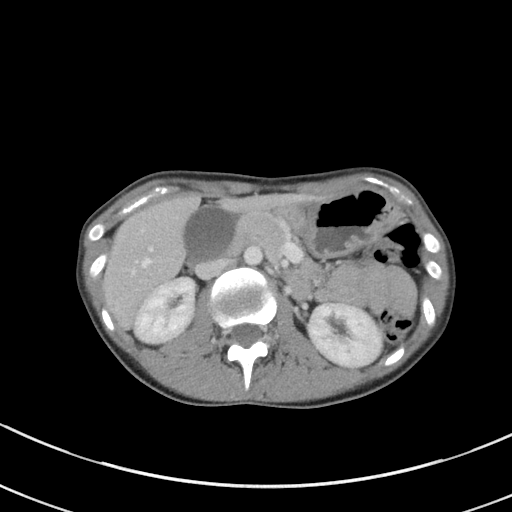
[im 67/83  soft-tissue]
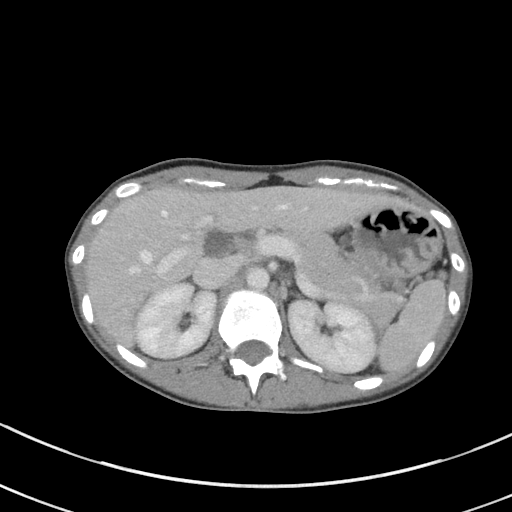
[im 73/83  soft-tissue]
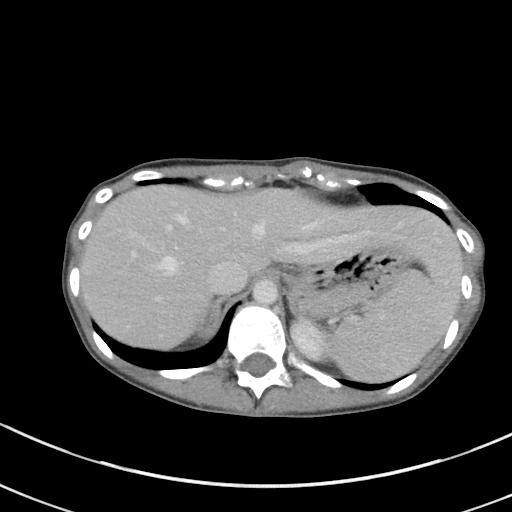
[im 79/83  soft-tissue]
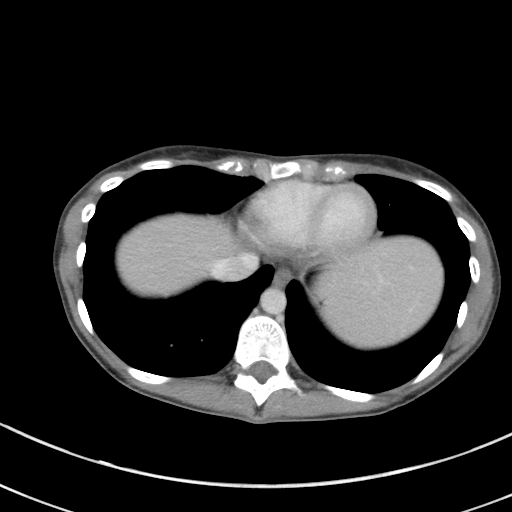

[Series 5: coronal st · coronal · 0.64mm/px · 3 of 61 slices shown]
[im 21/61  soft-tissue]
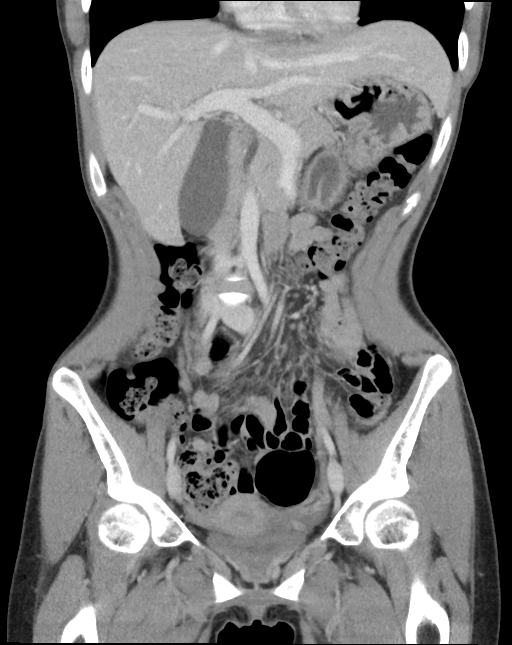
[im 27/61  soft-tissue]
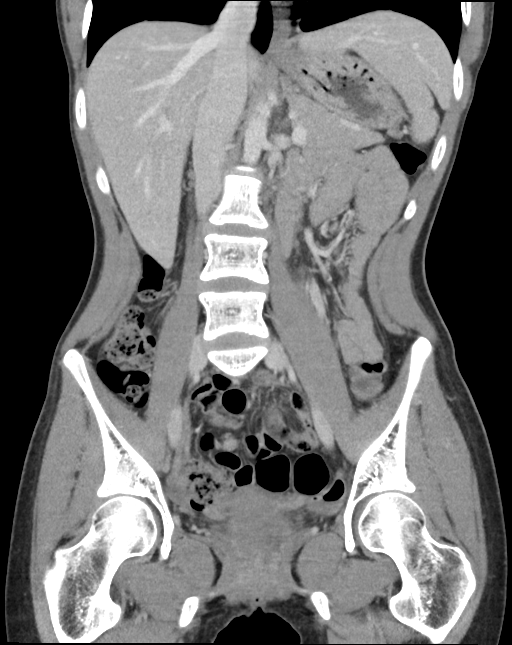
[im 34/61  soft-tissue]
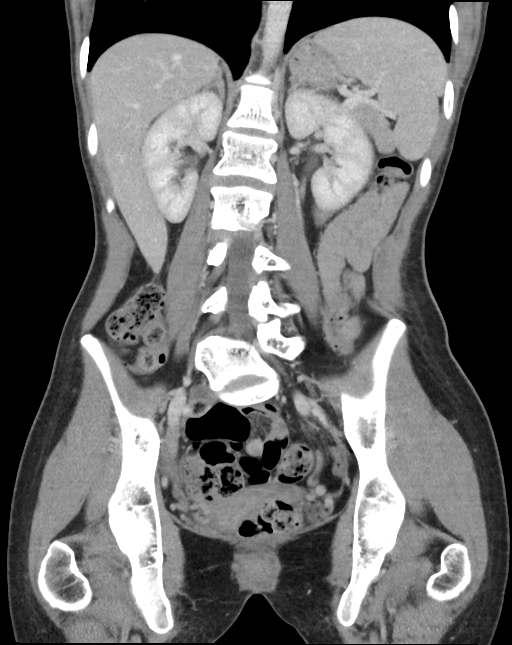

[16 of 46 positions shown; findings below may reference images not displayed]

FINDINGS: Lower chest: Lung bases are clear.

Hepatobiliary: No focal liver lesions are appreciable. Gallbladder
wall is not appreciably thickened. There is no biliary duct
dilatation.

Pancreas: No pancreatic mass or inflammatory focus.

Spleen: No splenic lesions are evident.

Adrenals/Urinary Tract: Adrenals bilaterally appear normal. Kidneys
bilaterally show no evident mass or hydronephrosis. No renal or
ureteral calculus. Urinary bladder is midline with questionable mild
wall thickening given nearly empty bladder.

Stomach/Bowel: There is no appreciable bowel wall or mesenteric
thickening. There is no appreciable bowel obstruction. No free air
or portal venous air.

Vascular/Lymphatic: No abdominal aortic aneurysm. No vascular
lesions are evident. There is no appreciable adenopathy in the
abdomen or pelvis.

Reproductive: The uterus is anteverted. There is no evident pelvic
mass.

Other: The appendix region appears normal. No abscess or ascites is
evident in the abdomen or pelvis.

Musculoskeletal: There is lumbar dextroscoliosis. There are no
blastic or lytic bone lesions. No intramuscular or abdominal wall
lesions are evident.
IMPRESSION: 1. Questionable mild urinary bladder wall thickening. There may be a
degree of cystitis. Correlation with urinalysis advised.

2. No appreciable bowel wall thickening or bowel obstruction.
Appendix region appears normal. No abscess.

3.  No renal or ureteral calculus.  No hydronephrosis.

## 2020-09-27 ENCOUNTER — Encounter: Payer: Self-pay | Admitting: Gastroenterology

## 2020-10-05 ENCOUNTER — Other Ambulatory Visit: Payer: Self-pay

## 2022-12-31 ENCOUNTER — Ambulatory Visit
Admission: RE | Admit: 2022-12-31 | Discharge: 2022-12-31 | Disposition: A | Discharge status: Home / Self Care | Payer: Medicaid Other | Source: Ambulatory Visit

## 2022-12-31 VITALS — BP 99/69 | HR 72 | Temp 98.0°F | Resp 16

## 2022-12-31 DIAGNOSIS — H66012 Acute suppurative otitis media with spontaneous rupture of ear drum, left ear: Secondary | ICD-10-CM | POA: Diagnosis not present

## 2022-12-31 MED ORDER — CIPROFLOXACIN HCL 500 MG PO TABS: 500.0000 mg | ORAL_TABLET | Freq: Two times a day (BID) | ORAL | 0 refills | Status: AC

## 2022-12-31 NOTE — ED Provider Notes (Signed)
[facility]    CSN: [id number] Arrival date & time: 12/31/22  4098      History   Chief Complaint Chief Complaint  Patient presents with   Ear Drainage    Entered by patient    HPI Kaitlin Barton is a 27 y.o. female.   HPI  27 year old female with a past medical history is significant for anxiety, depression, tension headaches, and TMJ presents for evaluation of left ear pain drainage has been going on for the past month.  She was evaluated in the ER on 12/28/2022 and discharged home on Keflex 500 mg twice daily for treatment of UTI as well as her ear infection.  She was also found to have bacterial vaginosis and was started on metronidazole.  She reports that the drainage from her left ear is green and the ER was concerned about the green discharge as well.  She feels like her discharge from her ear is worsening and not improving despite being on antibiotics for the last 3 days.  She denies any fever.  Past Medical History:  Diagnosis Date   Anxiety    Chronic tension headaches    Depression    TMJ (dislocation of temporomandibular joint)     Patient Active Problem List   Diagnosis Date Noted   Indication for care in labor and delivery, antepartum 10/16/2015   Pregnancy affected by fetal growth restriction 10/12/2015   Preterm contractions 08/30/2015    Past Surgical History:  Procedure Laterality Date   INNER EAR SURGERY      OB History     Gravida  1   Para      Term      Preterm      AB      Living         SAB      IAB      Ectopic      Multiple      Live Births               Home Medications    Prior to Admission medications   Medication Sig Start Date End Date Taking? Authorizing Provider  ciprofloxacin (CIPRO) 500 MG tablet Take 1 tablet (500 mg total) by mouth 2 (two) times daily for 7 days. 12/31/22 01/07/23 Yes Becky Augusta, NP  neomycin-polymyxin-hydrocortisone (CORTISPORIN) 3.5-10000-1 OTIC suspension Place 4 drops  into the right ear 3 (three) times daily. X 5 days 04/26/19   Verlee Monte, NP  sertraline (ZOLOFT) 25 MG tablet TAKE 1 TABLET BY MOUTH DAILY FOR MOOD 03/05/17   [provider]  albuterol (PROVENTIL HFA;VENTOLIN HFA) 108 (90 Base) MCG/ACT inhaler Inhale 2 puffs into the lungs every 6 (six) hours as needed for wheezing or shortness of breath. 05/13/17 04/26/19  Enid Derry, PA-C  famotidine (PEPCID) 20 MG tablet Take 1 tablet (20 mg total) by mouth 2 (two) times daily. 08/12/17 04/26/19  Sharman Cheek, MD  Ferrous Sulfate (IRON) 325 (65 Fe) MG TABS Take 1 tablet (325 mg total) by mouth 3 (three) times daily. 05/13/17 04/26/19  Enid Derry, PA-C  metoCLOPramide (REGLAN) 10 MG tablet Take 1 tablet (10 mg total) by mouth every 6 (six) hours as needed. 08/12/17 04/26/19  Sharman Cheek, MD    Family History No family history on file.  Social History Social History   Tobacco Use   Smoking status: Never   Smokeless tobacco: Never  Vaping Use   Vaping status: Every Day  Substance Use Topics  Alcohol use: No    Alcohol/week: 0.0 standard drinks of alcohol   Drug use: No    Frequency: 4.0 times per week     Allergies   Sulfa antibiotics   Review of Systems Review of Systems  Constitutional:  Negative for fever.  HENT:  Positive for ear discharge and ear pain.      Physical Exam Triage Vital Signs ED Triage Vitals [12/31/22 1006]  Encounter Vitals Group     BP      Systolic BP Percentile      Diastolic BP Percentile      Pulse      Resp      Temp      Temp src      SpO2      Weight      Height      Head Circumference      Peak Flow      Pain Score 4     Pain Loc      Pain Education      Exclude from Growth Chart    No data found.  Updated Vital Signs BP 99/69 (BP Location: Left Arm)   Pulse 72   Temp 98 F (36.7 C) (Oral)   Resp 16   LMP 12/27/2022   SpO2 100%   Visual Acuity Right Eye Distance:   Left Eye Distance:   Bilateral  Distance:    Right Eye Near:   Left Eye Near:    Bilateral Near:     Physical Exam Vitals and nursing note reviewed.  Constitutional:      Appearance: Normal appearance. She is not ill-appearing.  HENT:     Head: Normocephalic and atraumatic.     Right Ear: Tympanic membrane, ear canal and external ear normal. There is no impacted cerumen.     Left Ear: External ear normal.     Ears:     Comments: I am unable to visualize the tympanic membrane in the left ear as the ear canal is full of bloody green discharge. Skin:    General: Skin is warm and dry.     Capillary Refill: Capillary refill takes less than 2 seconds.  Neurological:     General: No focal deficit present.     Mental Status: She is alert and oriented to person, place, and time.      UC Treatments / Results  Labs (all labs ordered are listed, but only abnormal results are displayed) Labs Reviewed - No data to display  EKG   Radiology No results found.  Procedures Procedures (including critical care time)  Medications Ordered in UC Medications - No data to display  Initial Impression / Assessment and Plan / UC Course  I have reviewed the triage vital signs and the nursing notes.  Pertinent labs & imaging results that were available during my care of the patient were reviewed by me and considered in my medical decision making (see chart for details).   Patient is a nontoxic-appearing 27 year old female presenting for evaluation of 1 month worth of left ear pain and drainage as outlined HPI above.  Her exam does reveal bloody green discharge in the left external auditory canal.  Given that she is been on Keflex for 3 days and has not had any improvement of her symptoms, and she feels like they are getting worse, I will change her antibiotic therapy to Cipro 500 mg twice daily for 7 days.  The green color of the discharge is  concerning for possible Pseudomonas.  The Cipro should cover Pseudomonas as well as what  ever infectious agent is causing her UTI.  I have advised the patient that if she continues to have worsening of symptoms, develops fevers, or is not getting better that she should return to the ER for further evaluation.   Final Clinical Impressions(s) / UC Diagnoses   Final diagnoses:  Non-recurrent acute suppurative otitis media of left ear with spontaneous rupture of tympanic membrane     Discharge Instructions      Stop taking the Keflex and start taking the Cipro twice daily for 7 days.  Continue your Metronidazole for BV.  If you symptoms continue to worsen I would recommend returning to the ER for further evaluation.     ED Prescriptions     Medication Sig Dispense Auth. Provider   ciprofloxacin (CIPRO) 500 MG tablet Take 1 tablet (500 mg total) by mouth 2 (two) times daily for 7 days. 14 tablet Becky Augusta, NP      PDMP not reviewed this encounter.   Becky Augusta, NP 12/31/22 1023

## 2022-12-31 NOTE — ED Triage Notes (Signed)
Pt presents with left ear pain and drainage for 1 month. Pt was seen at the ED on 7/14 and was prescribed antibiotics.

## 2022-12-31 NOTE — Discharge Instructions (Signed)
Stop taking the Keflex and start taking the Cipro twice daily for 7 days.  Continue your Metronidazole for BV.  If you symptoms continue to worsen I would recommend returning to the ER for further evaluation.

## 2023-01-01 ENCOUNTER — Ambulatory Visit
Admission: RE | Admit: 2023-01-01 | Discharge: 2023-01-01 | Disposition: A | Discharge status: Home / Self Care | Payer: Medicaid Other | Source: Ambulatory Visit | Attending: Family Medicine | Admitting: Family Medicine

## 2023-01-01 VITALS — BP 117/82 | HR 90 | Temp 98.5°F | Ht 63.0 in | Wt 84.0 lb

## 2023-01-01 DIAGNOSIS — R300 Dysuria: Secondary | ICD-10-CM | POA: Insufficient documentation

## 2023-01-01 LAB — WET PREP, GENITAL
Clue Cells Wet Prep HPF POC: NONE SEEN
Sperm: NONE SEEN
Trich, Wet Prep: NONE SEEN
WBC, Wet Prep HPF POC: <10 (ref <10)
Yeast Wet Prep HPF POC: NONE SEEN

## 2023-01-01 LAB — URINALYSIS, W/ REFLEX TO CULTURE (INFECTION SUSPECTED)
Bilirubin Urine: NEGATIVE
Glucose, UA: NEGATIVE mg/dL
Hgb urine dipstick: NEGATIVE
Ketones, ur: NEGATIVE mg/dL
Nitrite: NEGATIVE
Protein, ur: NEGATIVE mg/dL
Specific Gravity, Urine: >1.03 — ABNORMAL HIGH (ref 1.005–1.030)
pH: 7 (ref 5.0–8.0)

## 2023-01-01 NOTE — ED Provider Notes (Signed)
[facility]    CSN: [id number] Arrival date & time: 01/01/23  1645      History   Chief Complaint Chief Complaint  Patient presents with   Urinary Frequency    I have a uti and bacterial vaginosis, feel it isn't getting better and I've been on antibiotics. I just wanna see what's going on - Entered by patient     HPI HPI Kaitlin Barton is a 27 y.o. female.    Kaitlin Barton presents for dysuria since Sunday. She was seen in the ED and diagnosed with BV, UTI and ear infection and given Cephalexin and Flagyl. She was seen yesterday came in for green discharge with blood in her ear and switched to Cipro. Has been drinking more water. Last felt dysuria on Tuesday but it came back today. Taking Cipro and Flagyl prior to arrival. Denies known STI exposure.  Kaitlin Barton does not use condoms regularly. She is not currently pregnant. She has same sex partner currently. Has a 28 yo.  Patient's last menstrual period was 07/06-13/2024.    - Abnormal vaginal discharge: white - vaginal odor: no - vaginal bleeding: no - Dysuria: yes - Hematuria: no - Urinary urgency: yes - Urinary frequency: yes  - Fever: no - Abdominal pain: yes - Pelvic pain: yes - Rash/Skin lesions/mouth ulcers: no - Nausea: yes  - Vomiting: no  - Back Pain: yes       Past Medical History:  Diagnosis Date   Anxiety    Chronic tension headaches    Depression    TMJ (dislocation of temporomandibular joint)     Patient Active Problem List   Diagnosis Date Noted   Indication for care in labor and delivery, antepartum 10/16/2015   Pregnancy affected by fetal growth restriction 10/12/2015   Preterm contractions 08/30/2015    Past Surgical History:  Procedure Laterality Date   INNER EAR SURGERY      OB History     Gravida  1   Para      Term      Preterm      AB      Living         SAB      IAB      Ectopic      Multiple      Live Births               Home Medications     Prior to Admission medications   Medication Sig Start Date End Date Taking? Authorizing Provider  ciprofloxacin (CIPRO) 500 MG tablet Take 1 tablet (500 mg total) by mouth 2 (two) times daily for 7 days. 12/31/22 01/07/23 Yes Becky Augusta, NP  neomycin-polymyxin-hydrocortisone (CORTISPORIN) 3.5-10000-1 OTIC suspension Place 4 drops into the right ear 3 (three) times daily. X 5 days 04/26/19  Yes Verlee Monte, NP  sertraline (ZOLOFT) 25 MG tablet TAKE 1 TABLET BY MOUTH DAILY FOR MOOD 03/05/17  Yes [provider]  albuterol (PROVENTIL HFA;VENTOLIN HFA) 108 (90 Base) MCG/ACT inhaler Inhale 2 puffs into the lungs every 6 (six) hours as needed for wheezing or shortness of breath. 05/13/17 04/26/19  Enid Derry, PA-C  famotidine (PEPCID) 20 MG tablet Take 1 tablet (20 mg total) by mouth 2 (two) times daily. 08/12/17 04/26/19  Sharman Cheek, MD  Ferrous Sulfate (IRON) 325 (65 Fe) MG TABS Take 1 tablet (325 mg total) by mouth 3 (three) times daily. 05/13/17 04/26/19  Enid Derry, PA-C  metoCLOPramide (REGLAN)  10 MG tablet Take 1 tablet (10 mg total) by mouth every 6 (six) hours as needed. 08/12/17 04/26/19  Sharman Cheek, MD    Family History History reviewed. No pertinent family history.  Social History Social History   Tobacco Use   Smoking status: Never   Smokeless tobacco: Never  Vaping Use   Vaping status: Every Day  Substance Use Topics   Alcohol use: No    Alcohol/week: 0.0 standard drinks of alcohol   Drug use: No    Frequency: 4.0 times per week     Allergies   Sulfa antibiotics   Review of Systems Review of Systems: :negative unless otherwise stated in HPI.      Physical Exam Triage Vital Signs ED Triage Vitals  Encounter Vitals Group     BP 01/01/23 1657 117/82     Systolic BP Percentile --      Diastolic BP Percentile --      Pulse Rate 01/01/23 1657 90     Resp --      Temp 01/01/23 1657 98.5 F (36.9 C)     Temp Source 01/01/23 1657 Oral      SpO2 01/01/23 1657 98 %     Weight 01/01/23 1655 84 lb (38.1 kg)     Height 01/01/23 1655 5\' 3"  (1.6 m)     Head Circumference --      Peak Flow --      Pain Score 01/01/23 1655 8     Pain Loc --      Pain Education --      Exclude from Growth Chart --    No data found.  Updated Vital Signs BP 117/82 (BP Location: Left Arm)   Pulse 90   Temp 98.5 F (36.9 C) (Oral)   Ht 5\' 3"  (1.6 m)   Wt 38.1 kg   LMP 12/27/2022   SpO2 98%   BMI 14.88 kg/m   Visual Acuity Right Eye Distance:   Left Eye Distance:   Bilateral Distance:    Right Eye Near:   Left Eye Near:    Bilateral Near:     Physical Exam GEN: well appearing female in no acute distress  CVS: well perfused  RESP: speaking in full sentences without pause  ABD: soft, non-tender, non-distended, no palpable masses   GU: deferred, patient performed self swab     UC Treatments / Results  Labs (all labs ordered are listed, but only abnormal results are displayed) Labs Reviewed  URINALYSIS, W/ REFLEX TO CULTURE (INFECTION SUSPECTED) - Abnormal; Notable for the following components:      Result Value   Specific Gravity, Urine >1.030 (*)    Leukocytes,Ua TRACE (*)    Bacteria, UA RARE (*)    All other components within normal limits  WET PREP, GENITAL  URINE CULTURE  CYTOLOGY, (ORAL, ANAL, URETHRAL) ANCILLARY ONLY    EKG   Radiology No results found.  Procedures Procedures (including critical care time)  Medications Ordered in UC Medications - No data to display  Initial Impression / Assessment and Plan / UC Course  I have reviewed the triage vital signs and the nursing notes.  Pertinent labs & imaging results that were available during my care of the patient were reviewed by me and considered in my medical decision making (see chart for details).      Patient is a 27 y.o.Marland Kitchen female  who presents for recurrent dysuria.  Overall patient is well-appearing and afebrile.  Vital signs stable.  UA  showing similar results are ED UA on 12/28/22, consistent with possible acute cystitis. Rare bacteria and no hematuria  on microscopy.  ED urine culture showed mixed urogenital flora.    Wet prep today showing no yeast and BV is clearing up with metronidazole.  Continue Cipro and metronidazole as previously prescribed.  Urine culture obtained.  Follow-up sensitivities and change antibiotics, if needed.  Self care instructions given including avoiding douching and scented body wash. Recommended Gonorrhea and Chlamydia testing obtained as she has not had any in a while.  Work note provided at patient's request.  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  Return precautions including abdominal pain, fever, chills, nausea, or vomiting given. Discussed MDM, treatment plan and plan for follow-up with patient who agrees with plan.     Final Clinical Impressions(s) / UC Diagnoses   Final diagnoses:  Dysuria     Discharge Instructions      Continue taking your metronidazole and your Cipro.  No changes to your medications were made today.  If your STD panel or your urine culture is positive, someone will call you about next steps.  You received your results in MyChart no matter what.     ED Prescriptions   None    PDMP not reviewed this encounter.   Katha Cabal, DO 01/01/23 1840

## 2023-01-01 NOTE — Discharge Instructions (Signed)
Continue taking your metronidazole and your Cipro.  No changes to your medications were made today.  If your STD panel or your urine culture is positive, someone will call you about next steps.  You received your results in MyChart no matter what.

## 2023-01-01 NOTE — ED Triage Notes (Signed)
Pt states that she was in the ED on Sunday for UTI and BV and pt is was told that her infection was "severe"  Pt states that her urine does not have any burning or odor but she felt weak today while at work. Pt states that her urine is burning again.   Pt was seen yesterday for a ear infection and was put on an antibiotic for both a UTI and ear infection.

## 2023-01-02 LAB — CYTOLOGY, (ORAL, ANAL, URETHRAL) ANCILLARY ONLY
Chlamydia: NEGATIVE
Comment: NEGATIVE
Comment: NORMAL
Neisseria Gonorrhea: NEGATIVE

## 2023-01-03 LAB — URINE CULTURE: Culture: NO GROWTH

## 2023-01-25 ENCOUNTER — Ambulatory Visit
Admission: EM | Admit: 2023-01-25 | Discharge: 2023-01-25 | Disposition: A | Discharge status: Home / Self Care | Payer: Medicaid Other | Attending: Emergency Medicine | Admitting: Emergency Medicine

## 2023-01-25 ENCOUNTER — Encounter: Payer: Self-pay | Admitting: Emergency Medicine

## 2023-01-25 DIAGNOSIS — Z1152 Encounter for screening for COVID-19: Secondary | ICD-10-CM | POA: Diagnosis not present

## 2023-01-25 DIAGNOSIS — B349 Viral infection, unspecified: Secondary | ICD-10-CM

## 2023-01-25 DIAGNOSIS — R197 Diarrhea, unspecified: Secondary | ICD-10-CM | POA: Diagnosis present

## 2023-01-25 DIAGNOSIS — R519 Headache, unspecified: Secondary | ICD-10-CM | POA: Diagnosis present

## 2023-01-25 LAB — SARS CORONAVIRUS 2 BY RT PCR: SARS Coronavirus 2 by RT PCR: NEGATIVE

## 2023-01-25 NOTE — ED Triage Notes (Signed)
Patient c/o headache, diarrhea, and bodyaches that started yesterday.  Patient has been exposed to COVID.  Patient unsure of fevers.

## 2023-01-25 NOTE — ED Provider Notes (Signed)
[facility]    CSN: [id number] Arrival date & time: 01/25/23  1111      History   Chief Complaint Chief Complaint  Patient presents with   Headache   Diarrhea   Generalized Body Aches    HPI Kaitlin Barton is a 27 y.o. female.   27 year old female, Kaitlin Barton, presents to Urgent care for evaluation of Headache,bodyaches,diarrhea that started yesterday, +exposure to Covid.  The history is provided by the patient. No language interpreter was used.    Past Medical History:  Diagnosis Date   Anxiety    Chronic tension headaches    Depression    TMJ (dislocation of temporomandibular joint)     Patient Active Problem List   Diagnosis Date Noted   Nonspecific syndrome suggestive of viral illness 01/25/2023   Indication for care in labor and delivery, antepartum 10/16/2015   Pregnancy affected by fetal growth restriction 10/12/2015   Preterm contractions 08/30/2015    Past Surgical History:  Procedure Laterality Date   INNER EAR SURGERY      OB History     Gravida  1   Para      Term      Preterm      AB      Living         SAB      IAB      Ectopic      Multiple      Live Births               Home Medications    Prior to Admission medications   Medication Sig Start Date End Date Taking? Authorizing Provider  neomycin-polymyxin-hydrocortisone (CORTISPORIN) 3.5-10000-1 OTIC suspension Place 4 drops into the right ear 3 (three) times daily. X 5 days 04/26/19   Verlee Monte, NP  sertraline (ZOLOFT) 25 MG tablet TAKE 1 TABLET BY MOUTH DAILY FOR MOOD 03/05/17   [provider]  albuterol (PROVENTIL HFA;VENTOLIN HFA) 108 (90 Base) MCG/ACT inhaler Inhale 2 puffs into the lungs every 6 (six) hours as needed for wheezing or shortness of breath. 05/13/17 04/26/19  Enid Derry, PA-C  famotidine (PEPCID) 20 MG tablet Take 1 tablet (20 mg total) by mouth 2 (two) times daily. 08/12/17 04/26/19  Sharman Cheek, MD  Ferrous Sulfate  (IRON) 325 (65 Fe) MG TABS Take 1 tablet (325 mg total) by mouth 3 (three) times daily. 05/13/17 04/26/19  Enid Derry, PA-C  metoCLOPramide (REGLAN) 10 MG tablet Take 1 tablet (10 mg total) by mouth every 6 (six) hours as needed. 08/12/17 04/26/19  Sharman Cheek, MD    Family History History reviewed. No pertinent family history.  Social History Social History   Tobacco Use   Smoking status: Never   Smokeless tobacco: Never  Vaping Use   Vaping status: Every Day  Substance Use Topics   Alcohol use: No    Alcohol/week: 0.0 standard drinks of alcohol   Drug use: No    Frequency: 4.0 times per week     Allergies   Sulfa antibiotics   Review of Systems Review of Systems  Constitutional:  Negative for fever.  Gastrointestinal:  Positive for diarrhea.  Musculoskeletal:  Positive for myalgias.  Neurological:  Positive for headaches.  All other systems reviewed and are negative.    Physical Exam Triage Vital Signs ED Triage Vitals  Encounter Vitals Group     BP      Systolic BP Percentile  Diastolic BP Percentile      Pulse      Resp      Temp      Temp src      SpO2      Weight      Height      Head Circumference      Peak Flow      Pain Score      Pain Loc      Pain Education      Exclude from Growth Chart    No data found.  Updated Vital Signs BP 126/87 (BP Location: Right Arm)   Pulse 85   Temp 98.1 F (36.7 C) (Oral)   Resp 14   Ht 5\' 3"  (1.6 m)   Wt 85 lb (38.6 kg)   LMP 01/22/2023 (Exact Date)   SpO2 100%   Breastfeeding No   BMI 15.06 kg/m   Visual Acuity Right Eye Distance:   Left Eye Distance:   Bilateral Distance:    Right Eye Near:   Left Eye Near:    Bilateral Near:     Physical Exam Vitals and nursing note reviewed.  Constitutional:      General: She is not in acute distress.    Appearance: She is well-developed.  HENT:     Head: Normocephalic and atraumatic.     Right Ear: Tympanic membrane is retracted.      Left Ear: Tympanic membrane is retracted.     Nose: Mucosal edema and congestion present.     Mouth/Throat:     Lips: Pink.     Mouth: Mucous membranes are moist.     Pharynx: Oropharynx is clear.  Eyes:     Conjunctiva/sclera: Conjunctivae normal.  Neck:     Trachea: Trachea normal.  Cardiovascular:     Rate and Rhythm: Normal rate and regular rhythm.     Pulses: Normal pulses.     Heart sounds: Normal heart sounds. No murmur heard. Pulmonary:     Effort: Pulmonary effort is normal. No respiratory distress.     Breath sounds: Normal breath sounds and air entry.  Abdominal:     Palpations: Abdomen is soft.     Tenderness: There is no abdominal tenderness.  Musculoskeletal:        General: No swelling.     Cervical back: Neck supple.  Lymphadenopathy:     Cervical: No cervical adenopathy.  Skin:    General: Skin is warm and dry.     Capillary Refill: Capillary refill takes less than 2 seconds.  Neurological:     General: No focal deficit present.     Mental Status: She is alert and oriented to person, place, and time.     GCS: GCS eye subscore is 4. GCS verbal subscore is 5. GCS motor subscore is 6.  Psychiatric:        Attention and Perception: Attention normal.        Mood and Affect: Mood normal.        Speech: Speech normal.        Behavior: Behavior normal.      UC Treatments / Results  Labs (all labs ordered are listed, but only abnormal results are displayed) Labs Reviewed  SARS CORONAVIRUS 2 BY RT PCR    EKG   Radiology No results found.  Procedures Procedures (including critical care time)  Medications Ordered in UC Medications - No data to display  Initial Impression / Assessment and Plan / UC Course  I have reviewed the triage vital signs and the nursing notes.  Pertinent labs & imaging results that were available during my care of the patient were reviewed by me and considered in my medical decision making (see chart for details).     Ddx:  Viral illness, gastroenteritis Final Clinical Impressions(s) / UC Diagnoses   Final diagnoses:  Nonspecific syndrome suggestive of viral illness     Discharge Instructions      Your covid test was negative. Most likely you have a viral illness: no antibiotic as indicated at this time, May treat with OTC meds of choice. Make sure to drink plenty of fluids to stay hydrated(gatorade, water, popsicles,jello,etc), avoid caffeine products. Follow up with PCP. Return as needed.     ED Prescriptions   None    PDMP not reviewed this encounter.   Clancy Gourd, NP 01/25/23 1358

## 2023-01-25 NOTE — Discharge Instructions (Addendum)
Your covid test was negative. Most likely you have a viral illness: no antibiotic as indicated at this time, May treat with OTC meds of choice. Make sure to drink plenty of fluids to stay hydrated(gatorade, water, popsicles,jello,etc), avoid caffeine products. Follow up with PCP. Return as needed.

## 2023-01-26 ENCOUNTER — Ambulatory Visit: Payer: Self-pay

## 2023-02-07 ENCOUNTER — Ambulatory Visit
Admission: RE | Admit: 2023-02-07 | Discharge: 2023-02-07 | Disposition: A | Discharge status: Home / Self Care | Payer: Medicaid Other | Source: Ambulatory Visit | Attending: Emergency Medicine | Admitting: Emergency Medicine

## 2023-02-07 VITALS — BP 113/77 | HR 87 | Temp 98.1°F | Resp 14 | Ht 63.0 in | Wt 85.0 lb

## 2023-02-07 DIAGNOSIS — B3731 Acute candidiasis of vulva and vagina: Secondary | ICD-10-CM

## 2023-02-07 DIAGNOSIS — B9689 Other specified bacterial agents as the cause of diseases classified elsewhere: Secondary | ICD-10-CM

## 2023-02-07 DIAGNOSIS — N760 Acute vaginitis: Secondary | ICD-10-CM | POA: Diagnosis not present

## 2023-02-07 LAB — URINALYSIS, W/ REFLEX TO CULTURE (INFECTION SUSPECTED)
Bilirubin Urine: NEGATIVE
Glucose, UA: NEGATIVE mg/dL
Hgb urine dipstick: NEGATIVE
Ketones, ur: NEGATIVE mg/dL
Leukocytes,Ua: NEGATIVE
Nitrite: NEGATIVE
Protein, ur: NEGATIVE mg/dL
Specific Gravity, Urine: >1.03 — ABNORMAL HIGH (ref 1.005–1.030)
pH: 5.5 (ref 5.0–8.0)

## 2023-02-07 LAB — WET PREP, GENITAL
Sperm: NONE SEEN
Trich, Wet Prep: NONE SEEN
WBC, Wet Prep HPF POC: <10 (ref <10)

## 2023-02-07 MED ORDER — METRONIDAZOLE 0.75 % EX CREA: TOPICAL_CREAM | Freq: Two times a day (BID) | CUTANEOUS | 0 refills | Status: DC

## 2023-02-07 MED ORDER — FLUCONAZOLE 150 MG PO TABS: 150.0000 mg | ORAL_TABLET | ORAL | 0 refills | Status: AC

## 2023-02-07 NOTE — Discharge Instructions (Addendum)
Instill 1 applicator of MetroGel intravaginally twice daily for treatment of your bacterial vaginosis.  Avoid alcohol while on the metronidazole as taken together will cause of vomiting.  Bacterial vaginosis is often caused by a imbalance of bacteria in your vaginal vault.  This is sometimes a result of using tampons or hormonal fluctuations during her menstrual cycle.  You if your symptoms are recurrent you can try using a boric acid suppository twice weekly to help maintain the acid-base balance in your vagina vault which could prevent further infection.  You can also try vaginal probiotics to help return normal bacterial balance.   Take Diflucan 150 mg tablets for treatment of your vaginal yeast infection.  Take 1 tablet now and repeat every 3 days for total of 3 doses.

## 2023-02-07 NOTE — ED Triage Notes (Signed)
Patient c/o RLQ abdominal pain that started early this morning.  Patient reports pain radiates to her lower back.  Patient reports some nausea.  Patient denies fevers.

## 2023-02-07 NOTE — ED Provider Notes (Signed)
[facility]    CSN: [id number] Arrival date & time: 02/07/23  1318      History   Chief Complaint Chief Complaint  Patient presents with   Abdominal Pain    Appointment   Back Pain    HPI Kaitlin Barton is a 26 y.o. female.   HPI  27 year old female with a past medical history significant for chronic tension headaches, anxiety, depression, TMJ, and recurrent BV presents for evaluation of right lower quadrant abdominal pain that started this morning.  The pain does radiate through to her back.  There is some nausea associated with it.  She states that she did have some burning with urination when she gave a sample here in clinic but she has not had dysuria, urgency, or frequency at home.  No fever or vaginal discharge.  She was seen in the emergency department at [facility] on 01/07/2023 with similar symptoms and were ports that the current symptoms feel same as those.  At that time she was diagnosed with BV, UTI, and had a negative pelvic ultrasound.  Past Medical History:  Diagnosis Date   Anxiety    Chronic tension headaches    Depression    TMJ (dislocation of temporomandibular joint)     Patient Active Problem List   Diagnosis Date Noted   Nonspecific syndrome suggestive of viral illness 01/25/2023   Indication for care in labor and delivery, antepartum 10/16/2015   Pregnancy affected by fetal growth restriction 10/12/2015   Preterm contractions 08/30/2015    Past Surgical History:  Procedure Laterality Date   INNER EAR SURGERY      OB History     Gravida  1   Para      Term      Preterm      AB      Living         SAB      IAB      Ectopic      Multiple      Live Births               Home Medications    Prior to Admission medications   Medication Sig Start Date End Date Taking? Authorizing Provider  fluconazole (DIFLUCAN) 150 MG tablet Take 1 tablet (150 mg total) by mouth every 3 (three) days for 3 doses. 02/07/23  02/14/23 Yes Becky Augusta, NP  metroNIDAZOLE (METROCREAM) 0.75 % cream Apply topically 2 (two) times daily. Insert 1 applicatorful twice daily for 5 days. 02/07/23  Yes Becky Augusta, NP  albuterol (PROVENTIL HFA;VENTOLIN HFA) 108 (90 Base) MCG/ACT inhaler Inhale 2 puffs into the lungs every 6 (six) hours as needed for wheezing or shortness of breath. 05/13/17 04/26/19  Enid Derry, PA-C  famotidine (PEPCID) 20 MG tablet Take 1 tablet (20 mg total) by mouth 2 (two) times daily. 08/12/17 04/26/19  Sharman Cheek, MD  Ferrous Sulfate (IRON) 325 (65 Fe) MG TABS Take 1 tablet (325 mg total) by mouth 3 (three) times daily. 05/13/17 04/26/19  Enid Derry, PA-C  metoCLOPramide (REGLAN) 10 MG tablet Take 1 tablet (10 mg total) by mouth every 6 (six) hours as needed. 08/12/17 04/26/19  Sharman Cheek, MD    Family History History reviewed. No pertinent family history.  Social History Social History   Tobacco Use   Smoking status: Never   Smokeless tobacco: Never  Vaping Use   Vaping status: Every Day  Substance Use Topics   Alcohol use: No  Alcohol/week: 0.0 standard drinks of alcohol   Drug use: No    Frequency: 4.0 times per week     Allergies   Sulfa antibiotics   Review of Systems Review of Systems  Constitutional:  Negative for fever.  Gastrointestinal:  Positive for abdominal pain and nausea. Negative for vomiting.  Genitourinary:  Positive for dysuria. Negative for frequency, hematuria, urgency, vaginal discharge and vaginal pain.  Musculoskeletal:  Positive for back pain.     Physical Exam Triage Vital Signs ED Triage Vitals  Encounter Vitals Group     BP      Systolic BP Percentile      Diastolic BP Percentile      Pulse      Resp      Temp      Temp src      SpO2      Weight      Height      Head Circumference      Peak Flow      Pain Score      Pain Loc      Pain Education      Exclude from Growth Chart    No data found.  Updated Vital  Signs BP 113/77 (BP Location: Left Arm)   Pulse 87   Temp 98.1 F (36.7 C) (Oral)   Resp 14   Ht 5\' 3"  (1.6 m)   Wt 85 lb (38.6 kg)   LMP 01/22/2023 (Exact Date)   SpO2 99%   BMI 15.06 kg/m   Visual Acuity Right Eye Distance:   Left Eye Distance:   Bilateral Distance:    Right Eye Near:   Left Eye Near:    Bilateral Near:     Physical Exam Vitals and nursing note reviewed.  Constitutional:      Appearance: She is well-developed. She is not ill-appearing.  HENT:     Head: Normocephalic and atraumatic.  Cardiovascular:     Rate and Rhythm: Normal rate and regular rhythm.     Pulses: Normal pulses.     Heart sounds: Normal heart sounds. No murmur heard.    No friction rub. No gallop.  Pulmonary:     Effort: Pulmonary effort is normal.     Breath sounds: Normal breath sounds. No wheezing, rhonchi or rales.  Abdominal:     General: Abdomen is flat.     Palpations: Abdomen is soft.     Tenderness: There is abdominal tenderness. There is no guarding or rebound.     Comments: Mild suprapubic tenderness.  No guarding or rebound.  Neurological:     Mental Status: She is alert.      UC Treatments / Results  Labs (all labs ordered are listed, but only abnormal results are displayed) Labs Reviewed  WET PREP, GENITAL - Abnormal; Notable for the following components:      Result Value   Yeast Wet Prep HPF POC PRESENT (*)    Clue Cells Wet Prep HPF POC PRESENT (*)    All other components within normal limits  URINALYSIS, W/ REFLEX TO CULTURE (INFECTION SUSPECTED) - Abnormal; Notable for the following components:   APPearance HAZY (*)    Specific Gravity, Urine >1.030 (*)    Bacteria, UA FEW (*)    All other components within normal limits    EKG   Radiology No results found.  Procedures Procedures (including critical care time)  Medications Ordered in UC Medications - No data to display  Initial Impression /  Assessment and Plan / UC Course  I have reviewed  the triage vital signs and the nursing notes.  Pertinent labs & imaging results that were available during my care of the patient were reviewed by me and considered in my medical decision making (see chart for details).   Patient is a nontoxic-appearing 27 year old female presenting for evaluation of right lower quadrant abdominal pain with radiation to her back and nausea as outlined HPI above.  These are similar to her previous presentations for BV.  She was just treated for BV on 01/07/2023 with MetroGel.  She reports that she took all of the medication.  She also had her menstrual cycle approximately 2 weeks ago and she has noticed that she gets recurrent BV around her menstrual cycle.  She is not currently sexually active.  I will order a vaginal wet prep and urinalysis.  Vaginal wet prep is positive for both yeast and clue cells.  Urinalysis shows hazy appearance with high specific gravity but no leukocyte esterase, nitrates, or protein.  Reflex microscopy shows few bacteria with budding yeast and mucus but otherwise unremarkable.  I will discharge patient home with diagnosis of bacterial vaginosis and vaginal yeast infection.  She reports that she would prefer the MetroGel so I will prescribe MetroGel once nightly for 7 days.  I will also prescribe Diflucan 150 mg tablets, 1 tablet now and repeat every 3 days for total of 3 doses to treat her vaginal yeast infection.  Work note provided.   Final Clinical Impressions(s) / UC Diagnoses   Final diagnoses:  BV (bacterial vaginosis)  Vaginal yeast infection     Discharge Instructions      Instill 1 applicator of MetroGel intravaginally twice daily for treatment of your bacterial vaginosis.  Avoid alcohol while on the metronidazole as taken together will cause of vomiting.  Bacterial vaginosis is often caused by a imbalance of bacteria in your vaginal vault.  This is sometimes a result of using tampons or hormonal fluctuations during her  menstrual cycle.  You if your symptoms are recurrent you can try using a boric acid suppository twice weekly to help maintain the acid-base balance in your vagina vault which could prevent further infection.  You can also try vaginal probiotics to help return normal bacterial balance.   Take Diflucan 150 mg tablets for treatment of your vaginal yeast infection.  Take 1 tablet now and repeat every 3 days for total of 3 doses.     ED Prescriptions     Medication Sig Dispense Auth. Provider   metroNIDAZOLE (METROCREAM) 0.75 % cream Apply topically 2 (two) times daily. Insert 1 applicatorful twice daily for 5 days. 45 g Becky Augusta, NP   fluconazole (DIFLUCAN) 150 MG tablet Take 1 tablet (150 mg total) by mouth every 3 (three) days for 3 doses. 3 tablet Becky Augusta, NP      PDMP not reviewed this encounter.   Becky Augusta, NP 02/07/23 1409

## 2023-02-08 ENCOUNTER — Ambulatory Visit: Payer: Self-pay

## 2023-02-13 ENCOUNTER — Other Ambulatory Visit: Payer: Self-pay | Admitting: Medical Genetics

## 2023-02-13 DIAGNOSIS — Z006 Encounter for examination for normal comparison and control in clinical research program: Secondary | ICD-10-CM

## 2023-04-01 ENCOUNTER — Other Ambulatory Visit: Payer: Medicaid Other | Attending: Medical Genetics

## 2023-08-01 ENCOUNTER — Encounter: Payer: Self-pay | Admitting: Emergency Medicine

## 2023-08-01 ENCOUNTER — Ambulatory Visit
Admission: EM | Admit: 2023-08-01 | Discharge: 2023-08-01 | Disposition: A | Discharge status: Home / Self Care | Payer: Medicaid Other | Attending: Physician Assistant | Admitting: Physician Assistant

## 2023-08-01 DIAGNOSIS — N920 Excessive and frequent menstruation with regular cycle: Secondary | ICD-10-CM

## 2023-08-01 DIAGNOSIS — F419 Anxiety disorder, unspecified: Secondary | ICD-10-CM

## 2023-08-01 DIAGNOSIS — R5383 Other fatigue: Secondary | ICD-10-CM

## 2023-08-01 DIAGNOSIS — F32A Depression, unspecified: Secondary | ICD-10-CM

## 2023-08-01 MED ORDER — SERTRALINE HCL 25 MG PO TABS: 25.0000 mg | ORAL_TABLET | Freq: Every day | ORAL | 1 refills | Status: AC

## 2023-08-01 MED ORDER — HYDROXYZINE HCL 25 MG PO TABS: ORAL_TABLET | ORAL | 1 refills | Status: AC

## 2023-08-01 NOTE — ED Triage Notes (Signed)
 Patient states that she ended her menstrual period yesterday.  Patient states that she had heavier bleeding this cycle.  Patient states that she feels weak and fatigue.

## 2023-08-01 NOTE — ED Provider Notes (Signed)
 [facility]    CSN: [id number] Arrival date & time: 08/01/23  1305      History   Chief Complaint Chief Complaint  Patient presents with   Fatigue    HPI Kaitlin Barton is a 28 y.o. female with history of anxiety, depression, chronic tension headaches, TMJ, frequent BV and frequent UTIs.  She presents today for feeling fatigued for the past several days.  Reports having her menstrual period from February 9 to February 14.  States she does have to change her pads numerous times per day and occasionally has heavy bleeding with clots blood.  She has not had any bleeding today.  Patient also reports nausea, reduced appetite.  She also believes her symptoms of fatigue could be related to lack of sleep, insomnia and anxiety and depression.  She used to take sertraline and hydroxyzine for anxiety and depression but has been off of these medications for a while.  She moved to this area from the [place] and is going to see a new PCP for the first time next month.  She was supposed to see them this month but her appointment got moved.  She would like to get started back on antianxiety/antidepressant medication.  Denies any thoughts of self-harm.  Requests a work note for today.  HPI  Past Medical History:  Diagnosis Date   Anxiety    Chronic tension headaches    Depression    TMJ (dislocation of temporomandibular joint)     Patient Active Problem List   Diagnosis Date Noted   Nonspecific syndrome suggestive of viral illness 01/25/2023   Indication for care in labor and delivery, antepartum 10/16/2015   Pregnancy affected by fetal growth restriction 10/12/2015   Preterm contractions 08/30/2015    Past Surgical History:  Procedure Laterality Date   INNER EAR SURGERY      OB History     Gravida  1   Para      Term      Preterm      AB      Living         SAB      IAB      Ectopic      Multiple      Live Births               Home Medications     Prior to Admission medications   Medication Sig Start Date End Date Taking? Authorizing Provider  hydrOXYzine (ATARAX) 25 MG tablet Take 0.5 tabs q6h prn anxiety/insomnia 08/01/23  Yes Eusebio Friendly B, PA-C  sertraline (ZOLOFT) 25 MG tablet Take 1 tablet (25 mg total) by mouth daily. 08/01/23  Yes Eusebio Friendly B, PA-C  albuterol (PROVENTIL HFA;VENTOLIN HFA) 108 (90 Base) MCG/ACT inhaler Inhale 2 puffs into the lungs every 6 (six) hours as needed for wheezing or shortness of breath. 05/13/17 04/26/19  Enid Derry, PA-C  famotidine (PEPCID) 20 MG tablet Take 1 tablet (20 mg total) by mouth 2 (two) times daily. 08/12/17 04/26/19  Sharman Cheek, MD  Ferrous Sulfate (IRON) 325 (65 Fe) MG TABS Take 1 tablet (325 mg total) by mouth 3 (three) times daily. 05/13/17 04/26/19  Enid Derry, PA-C  metoCLOPramide (REGLAN) 10 MG tablet Take 1 tablet (10 mg total) by mouth every 6 (six) hours as needed. 08/12/17 04/26/19  Sharman Cheek, MD    Family History History reviewed. No pertinent family history.  Social History Social History   Tobacco Use   Smoking  status: Never   Smokeless tobacco: Never  Vaping Use   Vaping status: Every Day  Substance Use Topics   Alcohol use: No    Alcohol/week: 0.0 standard drinks of alcohol   Drug use: No    Frequency: 4.0 times per week     Allergies   Sulfa antibiotics   Review of Systems Review of Systems  Constitutional:  Positive for fatigue. Negative for fever.  HENT:  Negative for congestion.   Respiratory:  Negative for cough.   Cardiovascular:  Negative for chest pain.  Gastrointestinal:  Positive for nausea. Negative for abdominal pain, diarrhea and vomiting.  Genitourinary:  Positive for menstrual problem (heavy menstrual periods) and pelvic pain (with menstrual period, not now). Negative for difficulty urinating, dysuria, flank pain, frequency and vaginal discharge.  Musculoskeletal:  Negative for arthralgias and gait problem.   Neurological:  Negative for dizziness and headaches.  Psychiatric/Behavioral:  Positive for decreased concentration, dysphoric mood and sleep disturbance. Negative for suicidal ideas. The patient is nervous/anxious.      Physical Exam Triage Vital Signs ED Triage Vitals  Encounter Vitals Group     BP 08/01/23 1323 126/83     Systolic BP Percentile --      Diastolic BP Percentile --      Pulse Rate 08/01/23 1323 97     Resp 08/01/23 1323 15     Temp 08/01/23 1323 98.5 F (36.9 C)     Temp Source 08/01/23 1323 Oral     SpO2 08/01/23 1323 98 %     Weight 08/01/23 1321 92 lb (41.7 kg)     Height 08/01/23 1321 5\' 3"  (1.6 m)     Head Circumference --      Peak Flow --      Pain Score 08/01/23 1321 0     Pain Loc --      Pain Education --      Exclude from Growth Chart --    No data found.  Updated Vital Signs BP 126/83 (BP Location: Left Arm)   Pulse 97   Temp 98.5 F (36.9 C) (Oral)   Resp 15   Ht 5\' 3"  (1.6 m)   Wt 92 lb (41.7 kg)   LMP 07/28/2023 (Approximate)   SpO2 98%   BMI 16.30 kg/m       Physical Exam Vitals and nursing note reviewed.  Constitutional:      General: She is not in acute distress.    Appearance: Normal appearance. She is not ill-appearing or toxic-appearing.  HENT:     Head: Normocephalic and atraumatic.     Nose: Nose normal.     Mouth/Throat:     Mouth: Mucous membranes are moist.     Pharynx: Oropharynx is clear.  Eyes:     General: No scleral icterus.       Right eye: No discharge.        Left eye: No discharge.     Conjunctiva/sclera: Conjunctivae normal.  Cardiovascular:     Rate and Rhythm: Normal rate and regular rhythm.     Heart sounds: Normal heart sounds.  Pulmonary:     Effort: Pulmonary effort is normal. No respiratory distress.     Breath sounds: Normal breath sounds.  Abdominal:     Palpations: Abdomen is soft.     Tenderness: There is no abdominal tenderness. There is no right CVA tenderness or left CVA  tenderness.  Musculoskeletal:     Cervical back: Neck supple.  Skin:  General: Skin is dry.  Neurological:     General: No focal deficit present.     Mental Status: She is alert. Mental status is at baseline.     Motor: No weakness.     Gait: Gait normal.  Psychiatric:        Attention and Perception: Attention normal.        Mood and Affect: Mood is anxious and depressed.        Speech: Speech normal.        Behavior: Behavior is slowed.        Thought Content: Thought content is not paranoid. Thought content does not include homicidal or suicidal ideation. Thought content does not include homicidal or suicidal plan.      UC Treatments / Results  Labs (all labs ordered are listed, but only abnormal results are displayed) Labs Reviewed - No data to display  EKG   Radiology No results found.  Procedures Procedures (including critical care time)       Medications Ordered in UC Medications - No data to display  Initial Impression / Assessment and Plan / UC Course  I have reviewed the triage vital signs and the nursing notes.  Pertinent labs & imaging results that were available during my care of the patient were reviewed by me and considered in my medical decision making (see chart for details).   28 year old female with history anxiety depression presents for increased fatigue, nausea, loss of appetite and heavy menstrual periods.  Patient used to be on sertraline and hydroxyzine to help manage her anxiety depression but has been off of the medicine for a while.  New to this area from the [place].  Has an appointment with PCP next month.  Vitals are stable and normal.  She is overall well-appearing.  On exam she is a little anxious and depressed.  No abdominal tenderness.  Chest clear.  Heart regular rate and rhythm.  Patient filled out PHQ-9 anxiety and depression forms.  Results included in chart.  Patient's scores moderate to severe anxiety and depression  symptoms.  Sent 25 mg sertraline to pharmacy.  Patient reports this is the dose that she was on previously.  1 refill provided.  Also sent hydroxyzine with 1 refill provided.  Advised to keep appointment with new PCP next month.  Advise going to ED for any acute worsening of symptoms.  Referral placed to GYN.  Patient should be evaluated for heavy menstrual periods and pelvic pain during menstrual periods.  If acute worsening of the symptoms advised to go to ED.  Work note given.  Final Clinical Impressions(s) / UC Diagnoses   Final diagnoses:  Other fatigue  Anxiety  Depression, unspecified depression type  Menorrhagia with regular cycle     Discharge Instructions      -I sent sertraline and hydroxyzine to pharmacy since you are previously on this medication to help manage her anxiety and depression.  Keep your follow-up appointment with your new PCP next month.  If you feel that your anxiety depression acutely worsens or you feel like you could be a threat to yourself or someone else please go immediately to the ER. - I put a referral in to OB/GYN for you for heavy menstrual periods and fatigue as well as pelvic pain, but since you have Medicaid a referral may need to come from your PCP. - If fatigue worsens, you feel dizzy or pass out please go to the ER.     ED Prescriptions  Medication Sig Dispense Auth. Provider   sertraline (ZOLOFT) 25 MG tablet Take 1 tablet (25 mg total) by mouth daily. 30 tablet Eusebio Friendly B, PA-C   hydrOXYzine (ATARAX) 25 MG tablet Take 0.5 tabs q6h prn anxiety/insomnia 30 tablet Shirlee Latch, PA-C      PDMP not reviewed this encounter.   Shirlee Latch, PA-C 08/01/23 1423

## 2023-08-01 NOTE — Discharge Instructions (Addendum)
-  I sent sertraline and hydroxyzine to pharmacy since you are previously on this medication to help manage her anxiety and depression.  Keep your follow-up appointment with your new PCP next month.  If you feel that your anxiety depression acutely worsens or you feel like you could be a threat to yourself or someone else please go immediately to the ER. - I put a referral in to OB/GYN for you for heavy menstrual periods and fatigue as well as pelvic pain, but since you have Medicaid a referral may need to come from your PCP. - If fatigue worsens, you feel dizzy or pass out please go to the ER.

## 2023-08-25 ENCOUNTER — Encounter: Payer: Self-pay | Admitting: Physician Assistant

## 2023-08-25 ENCOUNTER — Ambulatory Visit
Admission: EM | Admit: 2023-08-25 | Discharge: 2023-08-25 | Disposition: A | Discharge status: Home / Self Care | Attending: Physician Assistant | Admitting: Physician Assistant

## 2023-08-25 ENCOUNTER — Ambulatory Visit (INDEPENDENT_AMBULATORY_CARE_PROVIDER_SITE_OTHER)

## 2023-08-25 DIAGNOSIS — W19XXXA Unspecified fall, initial encounter: Secondary | ICD-10-CM

## 2023-08-25 DIAGNOSIS — S63501A Unspecified sprain of right wrist, initial encounter: Secondary | ICD-10-CM | POA: Diagnosis not present

## 2023-08-25 DIAGNOSIS — M79601 Pain in right arm: Secondary | ICD-10-CM

## 2023-08-25 DIAGNOSIS — T07XXXA Unspecified multiple injuries, initial encounter: Secondary | ICD-10-CM | POA: Diagnosis not present

## 2023-08-25 MED ORDER — NAPROXEN 375 MG PO TABS: 375.0000 mg | ORAL_TABLET | Freq: Two times a day (BID) | ORAL | 0 refills | Status: AC | PRN

## 2023-08-25 NOTE — ED Provider Notes (Signed)
 [facility]    CSN: [id number] Arrival date & time: 08/25/23  0820      History   Chief Complaint Chief Complaint  Patient presents with   Hand Pain   Fall    HPI SHEINA MCLEISH is a 28 y.o. female presenting for 1 week history of right hand, wrist, forearm and elbow pain.  Patient reports she fell when kids were running in front of her at a skating rink.  Patient says she is continue to work and use her right hand and wrist and feels that symptoms of gotten worse over the past couple days.  She has taken ibuprofen and applied ice a couple of times.  Denies other injuries.  Denies numbness, weakness or tingling.  HPI  Past Medical History:  Diagnosis Date   Anxiety    Chronic tension headaches    Depression    TMJ (dislocation of temporomandibular joint)     Patient Active Problem List   Diagnosis Date Noted   Nonspecific syndrome suggestive of viral illness 01/25/2023   Indication for care in labor and delivery, antepartum 10/16/2015   Pregnancy affected by fetal growth restriction 10/12/2015   Preterm contractions 08/30/2015    Past Surgical History:  Procedure Laterality Date   INNER EAR SURGERY      OB History     Gravida  1   Para      Term      Preterm      AB      Living         SAB      IAB      Ectopic      Multiple      Live Births               Home Medications    Prior to Admission medications   Medication Sig Start Date End Date Taking? Authorizing Provider  hydrOXYzine (ATARAX) 25 MG tablet Take 0.5 tabs q6h prn anxiety/insomnia 08/01/23  Yes Eusebio Friendly B, PA-C  naproxen (NAPROSYN) 375 MG tablet Take 1 tablet (375 mg total) by mouth 2 (two) times daily as needed for moderate pain (pain score 4-6). 08/25/23  Yes Eusebio Friendly B, PA-C  sertraline (ZOLOFT) 25 MG tablet Take 1 tablet (25 mg total) by mouth daily. 08/01/23  Yes Eusebio Friendly B, PA-C  albuterol (PROVENTIL HFA;VENTOLIN HFA) 108 (90 Base) MCG/ACT inhaler  Inhale 2 puffs into the lungs every 6 (six) hours as needed for wheezing or shortness of breath. 05/13/17 04/26/19  Enid Derry, PA-C  famotidine (PEPCID) 20 MG tablet Take 1 tablet (20 mg total) by mouth 2 (two) times daily. 08/12/17 04/26/19  Sharman Cheek, MD  Ferrous Sulfate (IRON) 325 (65 Fe) MG TABS Take 1 tablet (325 mg total) by mouth 3 (three) times daily. 05/13/17 04/26/19  Enid Derry, PA-C  metoCLOPramide (REGLAN) 10 MG tablet Take 1 tablet (10 mg total) by mouth every 6 (six) hours as needed. 08/12/17 04/26/19  Sharman Cheek, MD    Family History History reviewed. No pertinent family history.  Social History Social History   Tobacco Use   Smoking status: Never   Smokeless tobacco: Never  Vaping Use   Vaping status: Every Day  Substance Use Topics   Alcohol use: No    Alcohol/week: 0.0 standard drinks of alcohol   Drug use: No    Frequency: 4.0 times per week     Allergies   Sulfa antibiotics   Review of Systems  Review of Systems  Musculoskeletal:  Positive for arthralgias. Negative for joint swelling.  Skin:  Negative for color change and wound.  Neurological:  Negative for weakness and numbness.     Physical Exam Triage Vital Signs ED Triage Vitals  Encounter Vitals Group     BP      Systolic BP Percentile      Diastolic BP Percentile      Pulse      Resp      Temp      Temp src      SpO2      Weight      Height      Head Circumference      Peak Flow      Pain Score      Pain Loc      Pain Education      Exclude from Growth Chart    No data found.  Updated Vital Signs BP 109/75 (BP Location: Left Arm)   Pulse 81   Temp 98 F (36.7 C) (Oral)   Resp 16   Ht 5\' 3"  (1.6 m)   Wt 92 lb (41.7 kg)   LMP 07/26/2023 (Approximate)   SpO2 98%   BMI 16.30 kg/m      Physical Exam Vitals and nursing note reviewed.  Constitutional:      General: She is not in acute distress.    Appearance: Normal appearance. She is not  ill-appearing or toxic-appearing.  HENT:     Head: Normocephalic and atraumatic.  Eyes:     General: No scleral icterus.       Right eye: No discharge.        Left eye: No discharge.     Conjunctiva/sclera: Conjunctivae normal.  Cardiovascular:     Rate and Rhythm: Normal rate.     Pulses: Normal pulses.  Pulmonary:     Effort: Pulmonary effort is normal. No respiratory distress.  Musculoskeletal:     Cervical back: Neck supple.     Comments: Right upper extremity: No significant swelling, contusions, wounds.  There is tenderness of the base of the thumb and distal radius, fifth metacarpal and distal ulna, proximal ulna, medial forearm dorsally, lateral epicondyle of elbow.  Reduced range of motion of wrist with flexion and extension.  Slightly reduced extension at elbow.  Normal pulses and cap refill.  Skin:    General: Skin is dry.  Neurological:     General: No focal deficit present.     Mental Status: She is alert. Mental status is at baseline.     Motor: No weakness.     Gait: Gait normal.  Psychiatric:        Mood and Affect: Mood normal.        Behavior: Behavior normal.      UC Treatments / Results  Labs (all labs ordered are listed, but only abnormal results are displayed) Labs Reviewed - No data to display  EKG   Radiology DG Hand Complete Right Result Date: 08/25/2023 CLINICAL DATA:  Right thumb, fifth metacarpal, distal radius and ulna, proximal ulna, and lateral epicondyle pain after fall 1 week ago. Right hand pain radiating up to elbow due to fall 1 week ago. Felt skating rink. EXAM: RIGHT HAND - COMPLETE 3+ VIEW; RIGHT FOREARM - 2 VIEW; RIGHT ELBOW - COMPLETE 3+ VIEW COMPARISON:  None Available. FINDINGS: Right hand: Neutral ulnar variance. Joint spaces are preserved. There is sclerosis within the distal lateral aspect of the middle  phalanx of the index finger. No cortical destruction. This is compatible with a benign process such as a bone island or sclerosing  bone process such as melorheostosis. No acute fracture or dislocation. Right forearm: Normal bone mineralization. No acute fracture is seen within the radius or ulna. Right elbow: Normal position of the distal anterior humeral fat pad without evidence of joint effusion. Normal bone mineralization. Joint spaces are preserved. No acute fracture or dislocation. IMPRESSION: No acute fracture or dislocation of the right hand, forearm, or elbow. Electronically Signed   By: Neita Garnet M.D.   On: 08/25/2023 11:46   DG Forearm Right Result Date: 08/25/2023 CLINICAL DATA:  Right thumb, fifth metacarpal, distal radius and ulna, proximal ulna, and lateral epicondyle pain after fall 1 week ago. Right hand pain radiating up to elbow due to fall 1 week ago. Felt skating rink. EXAM: RIGHT HAND - COMPLETE 3+ VIEW; RIGHT FOREARM - 2 VIEW; RIGHT ELBOW - COMPLETE 3+ VIEW COMPARISON:  None Available. FINDINGS: Right hand: Neutral ulnar variance. Joint spaces are preserved. There is sclerosis within the distal lateral aspect of the middle phalanx of the index finger. No cortical destruction. This is compatible with a benign process such as a bone island or sclerosing bone process such as melorheostosis. No acute fracture or dislocation. Right forearm: Normal bone mineralization. No acute fracture is seen within the radius or ulna. Right elbow: Normal position of the distal anterior humeral fat pad without evidence of joint effusion. Normal bone mineralization. Joint spaces are preserved. No acute fracture or dislocation. IMPRESSION: No acute fracture or dislocation of the right hand, forearm, or elbow. Electronically Signed   By: Neita Garnet M.D.   On: 08/25/2023 11:46   DG Elbow Complete Right Result Date: 08/25/2023 CLINICAL DATA:  Right thumb, fifth metacarpal, distal radius and ulna, proximal ulna, and lateral epicondyle pain after fall 1 week ago. Right hand pain radiating up to elbow due to fall 1 week ago. Felt skating  rink. EXAM: RIGHT HAND - COMPLETE 3+ VIEW; RIGHT FOREARM - 2 VIEW; RIGHT ELBOW - COMPLETE 3+ VIEW COMPARISON:  None Available. FINDINGS: Right hand: Neutral ulnar variance. Joint spaces are preserved. There is sclerosis within the distal lateral aspect of the middle phalanx of the index finger. No cortical destruction. This is compatible with a benign process such as a bone island or sclerosing bone process such as melorheostosis. No acute fracture or dislocation. Right forearm: Normal bone mineralization. No acute fracture is seen within the radius or ulna. Right elbow: Normal position of the distal anterior humeral fat pad without evidence of joint effusion. Normal bone mineralization. Joint spaces are preserved. No acute fracture or dislocation. IMPRESSION: No acute fracture or dislocation of the right hand, forearm, or elbow. Electronically Signed   By: Neita Garnet M.D.   On: 08/25/2023 11:46    Procedures Procedures (including critical care time)  Medications Ordered in UC Medications - No data to display  Initial Impression / Assessment and Plan / UC Course  I have reviewed the triage vital signs and the nursing notes.  Pertinent labs & imaging results that were available during my care of the patient were reviewed by me and considered in my medical decision making (see chart for details).   28 year old female presents for multiple injuries following accidental fall about a week ago.  Reports right hand, wrist, forearm and elbow pain.  Patient has multiple areas of tenderness including distal ulna and radius, base of thumb, fifth metacarpal,  mid forearm, proximal ulna and lateral epicondyles.  Reduced range of motion mildly at elbow with extension and of wrist.  Good pulses and strength.  X-ray of hand, forearm and elbow obtained.  Wet read on x-ray is all negative.  Wrist sprain.  Patient placed in thumb spica wrist brace.  Declined sling.  Reviewed RICE guidelines.  Sent naproxen to  pharmacy.  Also advised she may use Tylenol.  Discussed avoiding painful activities.  Work note was given.  Over read x-rays all negative.   Final Clinical Impressions(s) / UC Diagnoses   Final diagnoses:  Musculoskeletal pain of right upper extremity  Sprain of right wrist, initial encounter  Fall, initial encounter  Multiple injuries     Discharge Instructions      SPRAIN: Stressed avoiding painful activities . Reviewed RICE guidelines. Use medications as directed, including NSAIDs. If no NSAIDs have been prescribed for you today, you may take Aleve or Motrin over the counter. May use Tylenol in between doses of NSAIDs.  If no improvement in the next 1-2 weeks, f/u with PCP or return to our office for reexamination, and please feel free to call or return at any time for any questions or concerns you may have and we will be happy to help you!      I will contact you if the radiologist sees an abnormality I did not note.     ED Prescriptions     Medication Sig Dispense Auth. Provider   naproxen (NAPROSYN) 375 MG tablet Take 1 tablet (375 mg total) by mouth 2 (two) times daily as needed for moderate pain (pain score 4-6). 30 tablet Gareth Morgan      PDMP not reviewed this encounter.   Shirlee Latch, PA-C 08/25/23 1202

## 2023-08-25 NOTE — ED Triage Notes (Signed)
 Pt c/o R hand pain radiating up elbow d/t fall x1 wk. [place].

## 2023-08-25 NOTE — Discharge Instructions (Addendum)
 SPRAIN: Stressed avoiding painful activities . Reviewed RICE guidelines. Use medications as directed, including NSAIDs. If no NSAIDs have been prescribed for you today, you may take Aleve or Motrin over the counter. May use Tylenol in between doses of NSAIDs.  If no improvement in the next 1-2 weeks, f/u with PCP or return to our office for reexamination, and please feel free to call or return at any time for any questions or concerns you may have and we will be happy to help you!      I will contact you if the radiologist sees an abnormality I did not note.

## 2023-09-08 NOTE — Progress Notes (Unsigned)
    Emogene Morgan, MD   No chief complaint on file.   HPI:      [name] is a 28 y.o. G1P0 whose LMP was Patient's last menstrual period was 07/26/2023 (approximate)., presents today for NP eval menorrhagi Normal CBC 7/24   Patient Active Problem List   Diagnosis Date Noted   Nonspecific syndrome suggestive of viral illness 01/25/2023   Indication for care in labor and delivery, antepartum 10/16/2015   Pregnancy affected by fetal growth restriction 10/12/2015   Preterm contractions 08/30/2015    Past Surgical History:  Procedure Laterality Date   INNER EAR SURGERY      No family history on file.  Social History   Socioeconomic History   Marital status: Single    Spouse name: Not on file   Number of children: Not on file   Years of education: Not on file   Highest education level: Not on file  Occupational History   Not on file  Tobacco Use   Smoking status: Never   Smokeless tobacco: Never  Vaping Use   Vaping status: Every Day  Substance and Sexual Activity   Alcohol use: No    Alcohol/week: 0.0 standard drinks of alcohol   Drug use: No    Frequency: 4.0 times per week   Sexual activity: Yes  Other Topics Concern   Not on file  Social History Narrative   Not on file   Social Drivers of Health   Financial Resource Strain: Not on file  Food Insecurity: Not on file  Transportation Needs: Not on file  Physical Activity: Not on file  Stress: Not on file  Social Connections: Not on file  Intimate Partner Violence: Not on file    Outpatient Medications Prior to Visit  Medication Sig Dispense Refill   hydrOXYzine (ATARAX) 25 MG tablet Take 0.5 tabs q6h prn anxiety/insomnia 30 tablet 1   naproxen (NAPROSYN) 375 MG tablet Take 1 tablet (375 mg total) by mouth 2 (two) times daily as needed for moderate pain (pain score 4-6). 30 tablet 0   sertraline (ZOLOFT) 25 MG tablet Take 1 tablet (25 mg total) by mouth daily. 30 tablet 1   No  facility-administered medications prior to visit.      ROS:  Review of Systems BREAST: No symptoms   OBJECTIVE:   Vitals:  LMP 07/26/2023 (Approximate)   Physical Exam  Results: No results found for this or any previous visit (from the past 24 hours).   Assessment/Plan: No diagnosis found.    No orders of the defined types were placed in this encounter.     No follow-ups on file.  Maleke Feria B. Hermes Wafer, PA-C 09/08/2023 12:35 PM

## 2023-09-10 ENCOUNTER — Other Ambulatory Visit (HOSPITAL_COMMUNITY)
Admission: RE | Admit: 2023-09-10 | Discharge: 2023-09-10 | Disposition: A | Discharge status: Home / Self Care | Source: Ambulatory Visit | Attending: Obstetrics and Gynecology | Admitting: Obstetrics and Gynecology

## 2023-09-10 ENCOUNTER — Ambulatory Visit: Payer: Medicaid Other | Admitting: Obstetrics and Gynecology

## 2023-09-10 ENCOUNTER — Encounter: Payer: Self-pay | Admitting: Obstetrics and Gynecology

## 2023-09-10 VITALS — BP 98/64 | Ht 62.0 in | Wt 89.0 lb

## 2023-09-10 DIAGNOSIS — Z124 Encounter for screening for malignant neoplasm of cervix: Secondary | ICD-10-CM | POA: Diagnosis present

## 2023-09-10 DIAGNOSIS — Z113 Encounter for screening for infections with a predominantly sexual mode of transmission: Secondary | ICD-10-CM | POA: Insufficient documentation

## 2023-09-10 DIAGNOSIS — Z30017 Encounter for initial prescription of implantable subdermal contraceptive: Secondary | ICD-10-CM | POA: Diagnosis not present

## 2023-09-10 DIAGNOSIS — N921 Excessive and frequent menstruation with irregular cycle: Secondary | ICD-10-CM | POA: Diagnosis not present

## 2023-09-10 MED ORDER — ETONOGESTREL 68 MG ~~LOC~~ IMPL
68.0000 mg | DRUG_IMPLANT | Freq: Once | SUBCUTANEOUS | Status: AC
  Administered 2023-09-10: 68 mg via SUBCUTANEOUS

## 2023-09-10 NOTE — Patient Instructions (Signed)
I value your feedback and you entrusting [place] with your care. If you get a [facility] patient survey, I would appreciate you taking the time to let us know about your experience today. Thank you!  Remove the dressing in 24 hours,  keep the incision area dry for 24 hours and remove the Steristrip in 2-3  days.  Notify us if any signs of tenderness, redness, pain, or fevers develop.

## 2023-09-11 ENCOUNTER — Encounter: Payer: Self-pay | Admitting: Obstetrics and Gynecology

## 2023-09-11 NOTE — Telephone Encounter (Signed)
 Called pt. She still has compression wrap on, advised to take it off since it has been 24 hrs since Nexplanon insertion. She thinks the compression wrap must have been a little tight and she slept with a hair tie on wrist (same arm). Pt thinks between wrap and hair tie and possibly sleeping on arm made her arm a little swollen. Denies arm/fingers turning blue/purple and/or signs of infection, severe pain in arm. Will monitor over weekend, advised to go to ER/Urgent Care if needed.

## 2023-09-14 ENCOUNTER — Encounter: Payer: Self-pay | Admitting: Obstetrics and Gynecology

## 2023-09-14 LAB — CYTOLOGY - PAP
Chlamydia: NEGATIVE
Comment: NEGATIVE
Comment: NORMAL
Neisseria Gonorrhea: NEGATIVE

## 2023-09-22 ENCOUNTER — Encounter: Payer: Self-pay | Admitting: Obstetrics and Gynecology

## 2023-10-09 ENCOUNTER — Encounter: Payer: Self-pay | Admitting: Obstetrics and Gynecology

## 2023-10-12 ENCOUNTER — Ambulatory Visit (INDEPENDENT_AMBULATORY_CARE_PROVIDER_SITE_OTHER): Admitting: Obstetrics & Gynecology

## 2023-10-12 ENCOUNTER — Encounter: Payer: Self-pay | Admitting: Obstetrics and Gynecology

## 2023-10-12 ENCOUNTER — Other Ambulatory Visit (HOSPITAL_COMMUNITY)
Admission: RE | Admit: 2023-10-12 | Discharge: 2023-10-12 | Disposition: A | Discharge status: Home / Self Care | Source: Ambulatory Visit | Attending: Obstetrics & Gynecology | Admitting: Obstetrics & Gynecology

## 2023-10-12 VITALS — BP 121/85 | HR 77 | Ht 62.0 in | Wt 89.6 lb

## 2023-10-12 DIAGNOSIS — R87612 Low grade squamous intraepithelial lesion on cytologic smear of cervix (LGSIL): Secondary | ICD-10-CM | POA: Insufficient documentation

## 2023-10-12 DIAGNOSIS — N888 Other specified noninflammatory disorders of cervix uteri: Secondary | ICD-10-CM

## 2023-10-12 DIAGNOSIS — Z3202 Encounter for pregnancy test, result negative: Secondary | ICD-10-CM | POA: Diagnosis not present

## 2023-10-12 LAB — POCT URINE PREGNANCY: Preg Test, Ur: NEGATIVE

## 2023-10-12 NOTE — Progress Notes (Signed)
    GYNECOLOGY PROGRESS NOTE  Subjective:    Patient ID: Kaitlin Barton, female    DOB: 28-Oct-1995, 28 y.o.   MRN: [id number]  HPI  Patient is a 28 y.o. G1P0101 here today for a colpo due to a LGSIL pap recently. She has a Nexplanon  in place for period management and this is working well. She only has a little spotting. She has been abstinent for about a year.  The following portions of the patient's history were reviewed and updated as appropriate: allergies, current medications, past family history, past medical history, past social history, past surgical history, and problem list.  Review of Systems Pertinent items are noted in HPI.  She tells me that she had the Gardasil series in the past.  Objective:   Blood pressure 121/85, pulse 77, height 5\' 2"  (1.575 m), weight 89 lb 9.6 oz (40.6 kg), last menstrual period 09/22/2023. Body mass index is 16.39 kg/m. Well nourished, well hydrated White female, no apparent distress She is ambulating and conversing normally. UPT negative, consent signed, time out done Speculum placed. Cervix prepped with acetic acid. Transformation zone seen in its entirety. Colpo adequate. Colposcopic findings normal. ECC obtained. She tolerated the procedure well.    Assessment:   1. LGSIL on Pap smear of cervix      Plan:   1. LGSIL on Pap smear of cervix (Primary)  - await pathology I explained that if the ECC is abnormal, then she will need a LEEP.

## 2023-10-12 NOTE — Telephone Encounter (Signed)
 Had colpo this AM.

## 2023-10-13 LAB — SURGICAL PATHOLOGY

## 2023-10-17 ENCOUNTER — Encounter: Payer: Self-pay | Admitting: Obstetrics & Gynecology

## 2024-03-25 ENCOUNTER — Other Ambulatory Visit: Payer: Self-pay | Admitting: Genetic Counselor

## 2024-03-25 DIAGNOSIS — Z006 Encounter for examination for normal comparison and control in clinical research program: Secondary | ICD-10-CM

## 2024-04-26 ENCOUNTER — Encounter: Payer: Self-pay | Admitting: Obstetrics and Gynecology

## 2024-05-01 ENCOUNTER — Ambulatory Visit: Payer: Self-pay
